# Patient Record
Sex: Female | Born: 1985 | Race: White | Hispanic: No | Marital: Single | Smoking: Former smoker
Health system: Southern US, Community
[De-identification: ages and names within clinical notes are randomized; demographics above are authoritative.]

## PROBLEM LIST (undated history)

## (undated) DIAGNOSIS — N926 Irregular menstruation, unspecified: Principal | ICD-10-CM

## (undated) DIAGNOSIS — N390 Urinary tract infection, site not specified: Secondary | ICD-10-CM

## (undated) DIAGNOSIS — E785 Hyperlipidemia, unspecified: Secondary | ICD-10-CM

## (undated) DIAGNOSIS — R1032 Left lower quadrant pain: Secondary | ICD-10-CM

## (undated) DIAGNOSIS — L309 Dermatitis, unspecified: Secondary | ICD-10-CM

## (undated) DIAGNOSIS — N83202 Unspecified ovarian cyst, left side: Secondary | ICD-10-CM

## (undated) DIAGNOSIS — Z8619 Personal history of other infectious and parasitic diseases: Secondary | ICD-10-CM

## (undated) DIAGNOSIS — Z309 Encounter for contraceptive management, unspecified: Secondary | ICD-10-CM

## (undated) DIAGNOSIS — F0781 Postconcussional syndrome: Secondary | ICD-10-CM

## (undated) DIAGNOSIS — N9489 Other specified conditions associated with female genital organs and menstrual cycle: Secondary | ICD-10-CM

## (undated) DIAGNOSIS — G43109 Migraine with aura, not intractable, without status migrainosus: Secondary | ICD-10-CM

## (undated) DIAGNOSIS — R87619 Unspecified abnormal cytological findings in specimens from cervix uteri: Secondary | ICD-10-CM

## (undated) HISTORY — DX: Postconcussional syndrome: F07.81

## (undated) HISTORY — DX: Left lower quadrant pain: R10.32

## (undated) HISTORY — DX: Encounter for contraceptive management, unspecified: Z30.9

## (undated) HISTORY — DX: Irregular menstruation, unspecified: N92.6

## (undated) HISTORY — DX: Other specified conditions associated with female genital organs and menstrual cycle: N94.89

## (undated) HISTORY — DX: Personal history of other infectious and parasitic diseases: Z86.19

## (undated) HISTORY — DX: Migraine with aura, not intractable, without status migrainosus: G43.109

## (undated) HISTORY — DX: Unspecified ovarian cyst, left side: N83.202

## (undated) HISTORY — DX: Hyperlipidemia, unspecified: E78.5

## (undated) HISTORY — DX: Unspecified abnormal cytological findings in specimens from cervix uteri: R87.619

## (undated) HISTORY — DX: Dermatitis, unspecified: L30.9

---

## 2011-04-02 HISTORY — PX: COLPOSCOPY: SHX161

## 2012-06-10 ENCOUNTER — Emergency Department (HOSPITAL_COMMUNITY)
Admission: EM | Admit: 2012-06-10 | Discharge: 2012-06-10 | Disposition: A | Discharge status: Home / Self Care | Attending: Emergency Medicine | Admitting: Emergency Medicine

## 2012-06-10 ENCOUNTER — Encounter (HOSPITAL_COMMUNITY): Payer: Self-pay

## 2012-06-10 DIAGNOSIS — F17200 Nicotine dependence, unspecified, uncomplicated: Secondary | ICD-10-CM | POA: Insufficient documentation

## 2012-06-10 DIAGNOSIS — N390 Urinary tract infection, site not specified: Secondary | ICD-10-CM

## 2012-06-10 HISTORY — DX: Urinary tract infection, site not specified: N39.0

## 2012-06-10 LAB — URINALYSIS, ROUTINE W REFLEX MICROSCOPIC
Ketones, ur: NEGATIVE mg/dL
Nitrite: POSITIVE — AB
pH: 6 (ref 5.0–8.0)

## 2012-06-10 LAB — URINE MICROSCOPIC-ADD ON

## 2012-06-10 LAB — PREGNANCY, URINE: Preg Test, Ur: NEGATIVE

## 2012-06-10 MED ORDER — NITROFURANTOIN MONOHYD MACRO 100 MG PO CAPS
100.0000 mg | ORAL_CAPSULE | Freq: Two times a day (BID) | ORAL | Status: DC
  Administered 2012-06-10: 100 mg via ORAL
  Filled 2012-06-10: qty 1

## 2012-06-10 MED ORDER — NITROFURANTOIN MONOHYD MACRO 100 MG PO CAPS: 100.0000 mg | ORAL_CAPSULE | Freq: Two times a day (BID) | ORAL | Status: DC

## 2012-06-10 NOTE — ED Notes (Signed)
Pt reports that she had uti several months ago and now feels the same.

## 2012-06-10 NOTE — ED Notes (Signed)
Pt reports having uti 7 months ago and started having same symptoms yesterday, low back pain and describes as "contractions when she pees", denies any urinary freq. Or vaginal discharge.   No fevers. lmp sept. 29.

## 2012-06-10 NOTE — ED Provider Notes (Signed)
History  This chart was scribed for Dione Booze, MD by Bennett Scrape. This patient was seen in room APA12/APA12 and the patient's care was started at 9:30AM.  CSN: [id number]  Arrival date & time 06/10/12  0921   First MD Initiated Contact with Patient 06/10/12 0930      Chief Complaint  Patient presents with  . Urinary Tract Infection     The history is provided by the patient. No language interpreter was used.    Shirley Mendez is a 26 y.o. female with a h/o frequent UTIs who presents to the Emergency Department complaining of gradual onset, gradually worsening, constant right lower back pain described as sharp and non-radiating with associated urinary urgency that started yesterday. She rates her pain is a 6 or 7 out of 10 and states that the pain is aggravated by sitting. She denies having any improving factors. She reports experiencing similar symptoms with a prior diagnosed kidney infection 7 months ago. She states that her LNMP of 05/30/12 and reports that she is currently on birth control pills. She denies hematuria, frequency, fevers, chills, nausea and emesis as associated symptoms. She is a current everyday smoker but denies alcohol use.   Past Medical History  Diagnosis Date  . UTI (lower urinary tract infection)     History reviewed. No pertinent past surgical history.  No family history on file.  History  Substance Use Topics  . Smoking status: Current Every Day Smoker  . Smokeless tobacco: Not on file  . Alcohol Use: No    No OB history provided.  Review of Systems  Constitutional: Negative for fever and chills.  Gastrointestinal: Negative for nausea and vomiting.  Genitourinary: Positive for urgency. Negative for frequency and hematuria.  Musculoskeletal: Positive for back pain.  All other systems reviewed and are negative.    Allergies  Review of patient's allergies indicates no known allergies.  Home Medications  No current outpatient  prescriptions on file.  Triage Vitals: BP 124/76  Pulse 95  Resp 20  Ht 5\' 8"  (1.727 m)  Wt 124 lb (56.246 kg)  BMI 18.85 kg/m2  SpO2 100%  LMP 05/30/2012  Physical Exam  Nursing note and vitals reviewed. Constitutional: She is oriented to person, place, and time. She appears well-developed and well-nourished. No distress.  HENT:  Head: Normocephalic and atraumatic.  Eyes: Conjunctivae normal and EOM are normal.  Neck: Neck supple. No tracheal deviation present.  Cardiovascular: Normal rate and regular rhythm.   Pulmonary/Chest: Effort normal and breath sounds normal. No respiratory distress.  Abdominal: Soft. She exhibits no distension. There is tenderness (moderate tenderness to right mid to lower abdomen). There is no rebound and no guarding.       Right CVA tenderness  Musculoskeletal: Normal range of motion.  Neurological: She is alert and oriented to person, place, and time.  Skin: Skin is warm and dry.  Psychiatric: She has a normal mood and affect. Her behavior is normal.    ED Course  Procedures (including critical care time)  DIAGNOSTIC STUDIES: Oxygen Saturation is 100% on room air, normal by my interpretation.    COORDINATION OF CARE: 9:35AM-Discussed treatment plan which includes an UA with pt at bedside and pt agreed to plan.   Results for orders placed during the hospital encounter of 06/10/12  URINALYSIS, ROUTINE W REFLEX MICROSCOPIC      Component Value Range   Color, Urine YELLOW  YELLOW   APPearance HAZY (*) CLEAR   Specific Gravity, Urine 1.010  1.005 - 1.030   pH 6.0  5.0 - 8.0   Glucose, UA NEGATIVE  NEGATIVE mg/dL   Hgb urine dipstick LARGE (*) NEGATIVE   Bilirubin Urine NEGATIVE  NEGATIVE   Ketones, ur NEGATIVE  NEGATIVE mg/dL   Protein, ur NEGATIVE  NEGATIVE mg/dL   Urobilinogen, UA 0.2  0.0 - 1.0 mg/dL   Nitrite POSITIVE (*) NEGATIVE   Leukocytes, UA SMALL (*) NEGATIVE  PREGNANCY, URINE      Component Value Range   Preg Test, Ur  NEGATIVE  NEGATIVE  URINE MICROSCOPIC-ADD ON      Component Value Range   Squamous Epithelial / LPF MANY (*) RARE   WBC, UA 11-20  <3 WBC/hpf   RBC / HPF 3-6  <3 RBC/hpf   Bacteria, UA MANY (*) RARE    1. Urinary tract infection       MDM  Flank pain which may represent urinary tract infection or ureteral colic.  Urinalysis is come back showing evidence of urinary tract infection with positive nitrite. She is discharged with a prescription for nitrofurantoin.      I personally performed the services described in this documentation, which was scribed in my presence. The recorded information has been reviewed and considered.      Dione Booze, MD 06/10/12 [phone]

## 2012-08-28 ENCOUNTER — Emergency Department (HOSPITAL_BASED_OUTPATIENT_CLINIC_OR_DEPARTMENT_OTHER)
Admission: EM | Admit: 2012-08-28 | Discharge: 2012-08-28 | Disposition: A | Discharge status: Home / Self Care | Attending: Emergency Medicine | Admitting: Emergency Medicine

## 2012-08-28 ENCOUNTER — Encounter (HOSPITAL_BASED_OUTPATIENT_CLINIC_OR_DEPARTMENT_OTHER): Payer: Self-pay | Admitting: *Deleted

## 2012-08-28 DIAGNOSIS — Z79899 Other long term (current) drug therapy: Secondary | ICD-10-CM | POA: Insufficient documentation

## 2012-08-28 DIAGNOSIS — Z3202 Encounter for pregnancy test, result negative: Secondary | ICD-10-CM | POA: Insufficient documentation

## 2012-08-28 DIAGNOSIS — Z87440 Personal history of urinary (tract) infections: Secondary | ICD-10-CM | POA: Insufficient documentation

## 2012-08-28 DIAGNOSIS — J020 Streptococcal pharyngitis: Secondary | ICD-10-CM

## 2012-08-28 DIAGNOSIS — F17200 Nicotine dependence, unspecified, uncomplicated: Secondary | ICD-10-CM | POA: Insufficient documentation

## 2012-08-28 LAB — RAPID STREP SCREEN (MED CTR MEBANE ONLY): Streptococcus, Group A Screen (Direct): POSITIVE — AB

## 2012-08-28 MED ORDER — HYDROCODONE-ACETAMINOPHEN 5-325 MG PO TABS
2.0000 | ORAL_TABLET | Freq: Once | ORAL | Status: AC
  Administered 2012-08-28: 2 via ORAL
  Filled 2012-08-28: qty 2

## 2012-08-28 MED ORDER — HYDROCODONE-ACETAMINOPHEN 5-325 MG PO TABS: 1.0000 | ORAL_TABLET | Freq: Four times a day (QID) | ORAL | Status: DC | PRN

## 2012-08-28 MED ORDER — PENICILLIN G BENZATHINE 1200000 UNIT/2ML IM SUSP
1.2000 10*6.[IU] | Freq: Once | INTRAMUSCULAR | Status: AC
  Administered 2012-08-28: 1.2 10*6.[IU] via INTRAMUSCULAR
  Filled 2012-08-28: qty 2

## 2012-08-28 NOTE — ED Notes (Signed)
Sore throat, fever (102) since Christmas.

## 2012-08-28 NOTE — ED Provider Notes (Signed)
History     CSN: [id number]  Arrival date & time 08/28/12  1320   First MD Initiated Contact with Patient 08/28/12 1455      Chief Complaint  Patient presents with  . Sore Throat    (Consider location/radiation/quality/duration/timing/severity/associated sxs/prior treatment) HPI Pt with history of strep throat presents with about 4 days of fever, sore throat, occasional dry cough and diffuse myalgias. Sore throat is moderate to severe and worse with swallowing.  Past Medical History  Diagnosis Date  . UTI (lower urinary tract infection)     History reviewed. No pertinent past surgical history.  History reviewed. No pertinent family history.  History  Substance Use Topics  . Smoking status: Current Every Day Smoker  . Smokeless tobacco: Not on file  . Alcohol Use: No    OB History    Grav Para Term Preterm Abortions TAB SAB Ect Mult Living                  Review of Systems All other systems reviewed and are negative except as noted in HPI.   Allergies  Review of patient's allergies indicates no known allergies.  Home Medications   Current Outpatient Rx  Name  Route  Sig  Dispense  Refill  . IBUPROFEN 200 MG PO TABS   Oral   Take 800 mg by mouth every 6 (six) hours as needed. pain         . IBUPROFEN 800 MG PO TABS   Oral   Take 800 mg by mouth every 8 (eight) hours as needed. Pain.         Marland Kitchen NITROFURANTOIN MONOHYD MACRO 100 MG PO CAPS   Oral   Take 1 capsule (100 mg total) by mouth 2 (two) times daily.   14 capsule   0   . NORELGESTROMIN-ETH ESTRADIOL 150-20 MCG/24HR TD PTWK   Transdermal   Place 1 patch onto the skin once a week.           BP 124/81  Pulse 102  Temp 98.1 F (36.7 C) (Oral)  Resp 16  Ht 5\' 9"  (1.753 m)  Wt 135 lb (61.236 kg)  BMI 19.94 kg/m2  SpO2 100%  LMP 08/26/2012  Physical Exam  Nursing note and vitals reviewed. Constitutional: She is oriented to person, place, and time. She appears well-developed and  well-nourished.  HENT:  Head: Normocephalic and atraumatic.  Mouth/Throat: Oropharyngeal exudate present.       Tonsils are 1+, erythematous, exudate  Eyes: EOM are normal. Pupils are equal, round, and reactive to light.  Neck: Normal range of motion. Neck supple.  Cardiovascular: Normal rate, normal heart sounds and intact distal pulses.   Pulmonary/Chest: Effort normal and breath sounds normal.  Abdominal: Bowel sounds are normal. She exhibits no distension. There is no tenderness.  Musculoskeletal: Normal range of motion. She exhibits no edema and no tenderness.  Lymphadenopathy:    She has cervical adenopathy.  Neurological: She is alert and oriented to person, place, and time. She has normal strength. No cranial nerve deficit or sensory deficit.  Skin: Skin is warm and dry. No rash noted.  Psychiatric: She has a normal mood and affect.    ED Course  Procedures (including critical care time)  Labs Reviewed  RAPID STREP SCREEN - Abnormal; Notable for the following:    Streptococcus, Group A Screen (Direct) POSITIVE (*)     All other components within normal limits  PREGNANCY, URINE   No results found.  No diagnosis found.    MDM  Strep pharyngitis, PCN, pain meds, PCP followup.         Charles B. Bernette Mayers, MD 08/28/12 1501

## 2013-09-13 ENCOUNTER — Ambulatory Visit: Payer: Self-pay

## 2013-12-24 ENCOUNTER — Emergency Department (HOSPITAL_COMMUNITY)
Admission: EM | Admit: 2013-12-24 | Discharge: 2013-12-24 | Disposition: A | Discharge status: Home / Self Care | Payer: Medicaid Other | Attending: Emergency Medicine | Admitting: Emergency Medicine

## 2013-12-24 ENCOUNTER — Encounter (HOSPITAL_COMMUNITY): Payer: Self-pay | Admitting: Emergency Medicine

## 2013-12-24 ENCOUNTER — Emergency Department (HOSPITAL_COMMUNITY): Payer: Medicaid Other

## 2013-12-24 DIAGNOSIS — F17200 Nicotine dependence, unspecified, uncomplicated: Secondary | ICD-10-CM | POA: Insufficient documentation

## 2013-12-24 DIAGNOSIS — Z3202 Encounter for pregnancy test, result negative: Secondary | ICD-10-CM | POA: Insufficient documentation

## 2013-12-24 DIAGNOSIS — N390 Urinary tract infection, site not specified: Secondary | ICD-10-CM | POA: Insufficient documentation

## 2013-12-24 LAB — URINALYSIS, ROUTINE W REFLEX MICROSCOPIC
BILIRUBIN URINE: NEGATIVE
GLUCOSE, UA: NEGATIVE mg/dL
KETONES UR: NEGATIVE mg/dL
Leukocytes, UA: NEGATIVE
Nitrite: POSITIVE — AB
Specific Gravity, Urine: 1.02 (ref 1.005–1.030)
Urobilinogen, UA: 0.2 mg/dL (ref 0.0–1.0)
pH: 6 (ref 5.0–8.0)

## 2013-12-24 LAB — COMPREHENSIVE METABOLIC PANEL
ALBUMIN: 3.9 g/dL (ref 3.5–5.2)
ALT: 14 U/L (ref 0–35)
AST: 17 U/L (ref 0–37)
Alkaline Phosphatase: 57 U/L (ref 39–117)
BUN: 13 mg/dL (ref 6–23)
CHLORIDE: 98 meq/L (ref 96–112)
CO2: 24 mEq/L (ref 19–32)
CREATININE: 0.73 mg/dL (ref 0.50–1.10)
Calcium: 10 mg/dL (ref 8.4–10.5)
GFR calc Af Amer: >90 mL/min (ref >90)
GFR calc non Af Amer: >90 mL/min (ref >90)
Glucose, Bld: 65 mg/dL — ABNORMAL LOW (ref 70–99)
Potassium: 3.5 mEq/L — ABNORMAL LOW (ref 3.7–5.3)
SODIUM: 138 meq/L (ref 137–147)
Total Bilirubin: 0.6 mg/dL (ref 0.3–1.2)
Total Protein: 7.6 g/dL (ref 6.0–8.3)

## 2013-12-24 LAB — CBC WITH DIFFERENTIAL/PLATELET
Basophils Absolute: 0 10*3/uL (ref 0.0–0.1)
Basophils Relative: 0 % (ref 0–1)
EOS ABS: 0.1 10*3/uL (ref 0.0–0.7)
EOS PCT: 1 % (ref 0–5)
HCT: 40.4 % (ref 36.0–46.0)
Hemoglobin: 14.1 g/dL (ref 12.0–15.0)
LYMPHS ABS: 1.9 10*3/uL (ref 0.7–4.0)
Lymphocytes Relative: 14 % (ref 12–46)
MCH: 30.5 pg (ref 26.0–34.0)
MCHC: 34.9 g/dL (ref 30.0–36.0)
MCV: 87.3 fL (ref 78.0–100.0)
Monocytes Absolute: 1.1 10*3/uL — ABNORMAL HIGH (ref 0.1–1.0)
Monocytes Relative: 8 % (ref 3–12)
Neutro Abs: 10.2 10*3/uL — ABNORMAL HIGH (ref 1.7–7.7)
Neutrophils Relative %: 77 % (ref 43–77)
PLATELETS: 174 10*3/uL (ref 150–400)
RBC: 4.63 MIL/uL (ref 3.87–5.11)
RDW: 12.1 % (ref 11.5–15.5)
WBC: 13.2 10*3/uL — ABNORMAL HIGH (ref 4.0–10.5)

## 2013-12-24 LAB — URINE MICROSCOPIC-ADD ON

## 2013-12-24 LAB — PREGNANCY, URINE: Preg Test, Ur: NEGATIVE

## 2013-12-24 LAB — LIPASE, BLOOD: LIPASE: 15 U/L (ref 11–59)

## 2013-12-24 MED ORDER — OXYCODONE-ACETAMINOPHEN 5-325 MG PO TABS: 2.0000 | ORAL_TABLET | ORAL | Status: DC | PRN

## 2013-12-24 MED ORDER — PROMETHAZINE HCL 25 MG PO TABS: 25.0000 mg | ORAL_TABLET | Freq: Four times a day (QID) | ORAL | Status: DC | PRN

## 2013-12-24 MED ORDER — ONDANSETRON HCL 4 MG/2ML IJ SOLN
4.0000 mg | Freq: Once | INTRAMUSCULAR | Status: AC
  Administered 2013-12-24: 4 mg via INTRAVENOUS
  Filled 2013-12-24: qty 2

## 2013-12-24 MED ORDER — DEXTROSE 5 % IV SOLN: 1.0000 g | Freq: Once | INTRAVENOUS | Status: DC

## 2013-12-24 MED ORDER — CIPROFLOXACIN HCL 250 MG PO TABS
500.0000 mg | ORAL_TABLET | Freq: Once | ORAL | Status: AC
  Administered 2013-12-24: 500 mg via ORAL
  Filled 2013-12-24: qty 2

## 2013-12-24 MED ORDER — SODIUM CHLORIDE 0.9 % IV BOLUS (SEPSIS)
1000.0000 mL | Freq: Once | INTRAVENOUS | Status: AC
  Administered 2013-12-24: 1000 mL via INTRAVENOUS

## 2013-12-24 MED ORDER — SODIUM CHLORIDE 0.9 % IV SOLN
Freq: Once | INTRAVENOUS | Status: AC
  Administered 2013-12-24: 13:00:00 via INTRAVENOUS

## 2013-12-24 MED ORDER — CIPROFLOXACIN HCL 500 MG PO TABS: 500.0000 mg | ORAL_TABLET | Freq: Two times a day (BID) | ORAL | Status: DC

## 2013-12-24 MED ORDER — MORPHINE SULFATE 4 MG/ML IJ SOLN
4.0000 mg | Freq: Once | INTRAMUSCULAR | Status: AC
  Administered 2013-12-24: 4 mg via INTRAVENOUS
  Filled 2013-12-24: qty 1

## 2013-12-24 MED ORDER — MORPHINE SULFATE 4 MG/ML IJ SOLN
INTRAMUSCULAR | Status: AC
  Administered 2013-12-24: 4 mg via INTRAVENOUS
  Filled 2013-12-24: qty 1

## 2013-12-24 MED ORDER — HYDROMORPHONE HCL PF 1 MG/ML IJ SOLN
1.0000 mg | Freq: Once | INTRAMUSCULAR | Status: AC
  Administered 2013-12-24: 1 mg via INTRAVENOUS
  Filled 2013-12-24: qty 1

## 2013-12-24 MED ORDER — KETOROLAC TROMETHAMINE 30 MG/ML IJ SOLN
INTRAMUSCULAR | Status: AC
  Administered 2013-12-24: 30 mg
  Filled 2013-12-24: qty 1

## 2013-12-24 MED ORDER — MORPHINE SULFATE 4 MG/ML IJ SOLN
4.0000 mg | Freq: Once | INTRAMUSCULAR | Status: AC
  Administered 2013-12-24: 4 mg via INTRAVENOUS

## 2013-12-24 NOTE — ED Notes (Signed)
Talked with EDP about dc vital. vo to run 3rd liter wo.

## 2013-12-24 NOTE — ED Notes (Signed)
Advised pt of another liter. Pt upset and does not agree with diagnosis. Asked pt if there was anything else we could do for her and advised we wanted her to feel like she received very good care. Pt shook head no but agreed to 3rd liter.

## 2013-12-24 NOTE — Discharge Instructions (Signed)
We will treat you for a urinary tract infection. Increase fluids. Antibiotic twice a day.   Also medication for pain and nausea

## 2013-12-24 NOTE — ED Provider Notes (Signed)
CSN: [id number]     Arrival date & time 12/24/13  [id number] History  This chart was scribed for Shirley HutchingBrian Kobe Jansma, MD by Quintella ReichertMatthew Underwood, ED scribe.  This patient was seen in room APA04/APA04 and the patient's care was started at 7:49 AM.    Chief Complaint  Patient presents with  . Flank Pain    The history is provided by the patient. No language interpreter was used.    HPI Comments: Shirley Mendez is a 28 y.o. female who presents to the Emergency Department complaining of moderate right flank pain that began last night at around 9:45 PM.  Pt states pain radiates intermittently to the right lateral aspect or her abdomen.  She also reports decreased urine output and states she feels dehydrated in spite of drinking sufficient fluids, although she notes she frequently does have difficulty "staying fully hydrated."  She denies associated fever, dysuria, or frequency.  Pt admits to prior h/o similar symptoms which were due to a kidney infection and treated successfully with home antibiotics.  She denies h/o kidney stones.  She denies any other chronic medical conditions or regular medication usage.   History reviewed. No pertinent past medical history.  History reviewed. No pertinent past surgical history.  History reviewed. No pertinent family history.   History  Substance Use Topics  . Smoking status: Current Every Day Smoker  . Smokeless tobacco: Not on file  . Alcohol Use: No    OB History   Grav Para Term Preterm Abortions TAB SAB Ect Mult Living                   Review of Systems A complete 10 system review of systems was obtained and all systems are negative except as noted in the HPI and PMH.     Allergies  Review of patient's allergies indicates no known allergies.  Home Medications   Prior to Admission medications   Not on File   BP 116/72  Pulse 112  Temp(Src) 98.3 F (36.8 C) (Oral)  Resp 20  Ht 5\' 7"  (1.702 m)  Wt 141 lb (63.957 kg)  BMI 22.08 kg/m2  SpO2 100%   LMP 12/18/2013  Physical Exam  Nursing note and vitals reviewed. Constitutional: She is oriented to person, place, and time. She appears well-developed and well-nourished.  HENT:  Head: Normocephalic and atraumatic.  Mouth/Throat: Mucous membranes are not dry.  Eyes: Conjunctivae and EOM are normal. Pupils are equal, round, and reactive to light.  Neck: Normal range of motion. Neck supple.  Cardiovascular: Normal rate, regular rhythm and normal heart sounds.   Pulmonary/Chest: Effort normal and breath sounds normal.  Abdominal: Soft. Bowel sounds are normal. There is tenderness (right flank).  Musculoskeletal: Normal range of motion.  Neurological: She is alert and oriented to person, place, and time.  Skin: Skin is warm and dry.  Psychiatric: She has a normal mood and affect. Her behavior is normal.    ED Course  Procedures (including critical care time)  DIAGNOSTIC STUDIES: Oxygen Saturation is 100% on room air, normal by my interpretation.    COORDINATION OF CARE: 7:53 AM-Discussed treatment plan which includes IV fluids, pain management, and UA with pt at bedside and pt agreed to plan.    Results for orders placed during the hospital encounter of 12/24/13  URINALYSIS, ROUTINE W REFLEX MICROSCOPIC      Result Value Ref Range   Color, Urine YELLOW  YELLOW   APPearance CLOUDY (*) CLEAR   Specific Gravity,  Urine 1.020  1.005 - 1.030   pH 6.0  5.0 - 8.0   Glucose, UA NEGATIVE  NEGATIVE mg/dL   Hgb urine dipstick LARGE (*) NEGATIVE   Bilirubin Urine NEGATIVE  NEGATIVE   Ketones, ur NEGATIVE  NEGATIVE mg/dL   Protein, ur TRACE (*) NEGATIVE mg/dL   Urobilinogen, UA 0.2  0.0 - 1.0 mg/dL   Nitrite POSITIVE (*) NEGATIVE   Leukocytes, UA NEGATIVE  NEGATIVE  PREGNANCY, URINE      Result Value Ref Range   Preg Test, Ur NEGATIVE  NEGATIVE  URINE MICROSCOPIC-ADD ON      Result Value Ref Range   Squamous Epithelial / LPF FEW (*) RARE   WBC, UA 0-2  <3 WBC/hpf   RBC / HPF 3-6   <3 RBC/hpf   Bacteria, UA MANY (*) RARE  COMPREHENSIVE METABOLIC PANEL      Result Value Ref Range   Sodium 138  137 - 147 mEq/L   Potassium 3.5 (*) 3.7 - 5.3 mEq/L   Chloride 98  96 - 112 mEq/L   CO2 24  19 - 32 mEq/L   Glucose, Bld 65 (*) 70 - 99 mg/dL   BUN 13  6 - 23 mg/dL   Creatinine, Ser 1.61  0.50 - 1.10 mg/dL   Calcium 09.6  8.4 - 04.5 mg/dL   Total Protein 7.6  6.0 - 8.3 g/dL   Albumin 3.9  3.5 - 5.2 g/dL   AST 17  0 - 37 U/L   ALT 14  0 - 35 U/L   Alkaline Phosphatase 57  39 - 117 U/L   Total Bilirubin 0.6  0.3 - 1.2 mg/dL   GFR calc non Af Amer >90  >90 mL/min   GFR calc Af Amer >90  >90 mL/min  CBC WITH DIFFERENTIAL      Result Value Ref Range   WBC 13.2 (*) 4.0 - 10.5 K/uL   RBC 4.63  3.87 - 5.11 MIL/uL   Hemoglobin 14.1  12.0 - 15.0 g/dL   HCT 40.9  81.1 - 91.4 %   MCV 87.3  78.0 - 100.0 fL   MCH 30.5  26.0 - 34.0 pg   MCHC 34.9  30.0 - 36.0 g/dL   RDW 78.2  95.6 - 21.3 %   Platelets 174  150 - 400 K/uL   Neutrophils Relative % 77  43 - 77 %   Neutro Abs 10.2 (*) 1.7 - 7.7 K/uL   Lymphocytes Relative 14  12 - 46 %   Lymphs Abs 1.9  0.7 - 4.0 K/uL   Monocytes Relative 8  3 - 12 %   Monocytes Absolute 1.1 (*) 0.1 - 1.0 K/uL   Eosinophils Relative 1  0 - 5 %   Eosinophils Absolute 0.1  0.0 - 0.7 K/uL   Basophils Relative 0  0 - 1 %   Basophils Absolute 0.0  0.0 - 0.1 K/uL  LIPASE, BLOOD      Result Value Ref Range   Lipase 15  11 - 59 U/L       MDM   Final diagnoses:  Urinary tract infection    Suspect urinary tract infection. CT A/P shows no kidney stone. Patient feels better after IV fluids and pain management.   We'll start Cipro twice a day along with Percocet and Phenergan 25 mg.  No acute abdomen.    I personally performed the services described in this documentation, which was scribed in my presence. The recorded  information has been reviewed and is accurate.    Shirley HutchingBrian Ayron Fillinger, MD 12/24/13 [phone]

## 2013-12-24 NOTE — ED Notes (Signed)
Pt c/o right flank pain that radiates around to right abd area at times that started last night, associated with nausea,

## 2013-12-24 NOTE — ED Notes (Signed)
Apple juice and graham crackers given due to borderline low bs

## 2013-12-26 ENCOUNTER — Encounter (HOSPITAL_BASED_OUTPATIENT_CLINIC_OR_DEPARTMENT_OTHER): Payer: Self-pay | Admitting: *Deleted

## 2013-12-27 ENCOUNTER — Emergency Department (HOSPITAL_COMMUNITY): Payer: Medicaid Other

## 2013-12-27 ENCOUNTER — Encounter (HOSPITAL_COMMUNITY): Payer: Self-pay | Admitting: Emergency Medicine

## 2013-12-27 ENCOUNTER — Emergency Department (HOSPITAL_COMMUNITY)
Admission: EM | Admit: 2013-12-27 | Discharge: 2013-12-27 | Disposition: A | Discharge status: Home / Self Care | Payer: Medicaid Other | Attending: Emergency Medicine | Admitting: Emergency Medicine

## 2013-12-27 DIAGNOSIS — N12 Tubulo-interstitial nephritis, not specified as acute or chronic: Secondary | ICD-10-CM

## 2013-12-27 DIAGNOSIS — Z792 Long term (current) use of antibiotics: Secondary | ICD-10-CM | POA: Insufficient documentation

## 2013-12-27 DIAGNOSIS — F17200 Nicotine dependence, unspecified, uncomplicated: Secondary | ICD-10-CM | POA: Insufficient documentation

## 2013-12-27 DIAGNOSIS — Z87440 Personal history of urinary (tract) infections: Secondary | ICD-10-CM | POA: Insufficient documentation

## 2013-12-27 DIAGNOSIS — R000 Tachycardia, unspecified: Secondary | ICD-10-CM | POA: Insufficient documentation

## 2013-12-27 LAB — CBC WITH DIFFERENTIAL/PLATELET
BASOS ABS: 0 10*3/uL (ref 0.0–0.1)
BASOS PCT: 0 % (ref 0–1)
EOS ABS: 0 10*3/uL (ref 0.0–0.7)
EOS PCT: 0 % (ref 0–5)
HCT: 39.2 % (ref 36.0–46.0)
Hemoglobin: 13.6 g/dL (ref 12.0–15.0)
LYMPHS ABS: 2.1 10*3/uL (ref 0.7–4.0)
Lymphocytes Relative: 22 % (ref 12–46)
MCH: 30.1 pg (ref 26.0–34.0)
MCHC: 34.7 g/dL (ref 30.0–36.0)
MCV: 86.7 fL (ref 78.0–100.0)
Monocytes Absolute: 1.5 10*3/uL — ABNORMAL HIGH (ref 0.1–1.0)
Monocytes Relative: 17 % — ABNORMAL HIGH (ref 3–12)
NEUTROS PCT: 61 % (ref 43–77)
Neutro Abs: 5.6 10*3/uL (ref 1.7–7.7)
PLATELETS: 179 10*3/uL (ref 150–400)
RBC: 4.52 MIL/uL (ref 3.87–5.11)
RDW: 12 % (ref 11.5–15.5)
WBC: 9.3 10*3/uL (ref 4.0–10.5)

## 2013-12-27 LAB — URINALYSIS, ROUTINE W REFLEX MICROSCOPIC
BILIRUBIN URINE: NEGATIVE
GLUCOSE, UA: NEGATIVE mg/dL
KETONES UR: NEGATIVE mg/dL
NITRITE: NEGATIVE
PH: 7 (ref 5.0–8.0)
Protein, ur: NEGATIVE mg/dL
Urobilinogen, UA: 2 mg/dL — ABNORMAL HIGH (ref 0.0–1.0)

## 2013-12-27 LAB — URINE MICROSCOPIC-ADD ON

## 2013-12-27 LAB — COMPREHENSIVE METABOLIC PANEL
ALBUMIN: 3.4 g/dL — AB (ref 3.5–5.2)
ALK PHOS: 49 U/L (ref 39–117)
ALT: 9 U/L (ref 0–35)
AST: 11 U/L (ref 0–37)
BUN: 11 mg/dL (ref 6–23)
CALCIUM: 9.6 mg/dL (ref 8.4–10.5)
CO2: 23 mEq/L (ref 19–32)
Chloride: 97 mEq/L (ref 96–112)
Creatinine, Ser: 0.71 mg/dL (ref 0.50–1.10)
GFR calc Af Amer: >90 mL/min (ref >90)
GFR calc non Af Amer: >90 mL/min (ref >90)
Glucose, Bld: 110 mg/dL — ABNORMAL HIGH (ref 70–99)
POTASSIUM: 3.7 meq/L (ref 3.7–5.3)
SODIUM: 135 meq/L — AB (ref 137–147)
TOTAL PROTEIN: 7.7 g/dL (ref 6.0–8.3)
Total Bilirubin: 0.5 mg/dL (ref 0.3–1.2)

## 2013-12-27 LAB — D-DIMER, QUANTITATIVE: D-Dimer, Quant: 1.32 ug/mL-FEU — ABNORMAL HIGH (ref 0.00–0.48)

## 2013-12-27 MED ORDER — SODIUM CHLORIDE 0.9 % IV BOLUS (SEPSIS)
1000.0000 mL | Freq: Once | INTRAVENOUS | Status: AC
  Administered 2013-12-27: 1000 mL via INTRAVENOUS

## 2013-12-27 MED ORDER — IOHEXOL 350 MG/ML SOLN
100.0000 mL | Freq: Once | INTRAVENOUS | Status: AC | PRN
  Administered 2013-12-27: 100 mL via INTRAVENOUS

## 2013-12-27 MED ORDER — DEXTROSE 5 % IV SOLN
1.0000 g | Freq: Once | INTRAVENOUS | Status: AC
  Administered 2013-12-27: 1 g via INTRAVENOUS
  Filled 2013-12-27: qty 10

## 2013-12-27 MED ORDER — SULFAMETHOXAZOLE-TRIMETHOPRIM 800-160 MG PO TABS: 1.0000 | ORAL_TABLET | Freq: Two times a day (BID) | ORAL | Status: DC

## 2013-12-27 NOTE — ED Notes (Signed)
MD at bedside. PA student

## 2013-12-27 NOTE — ED Provider Notes (Signed)
CSN: [id number]     Arrival date & time 12/27/13  1022 History   First MD Initiated Contact with Patient 12/27/13 1038     Chief Complaint  Patient presents with  . Pleurisy     (Consider location/radiation/quality/duration/timing/severity/associated sxs/prior Treatment) The history is provided by the patient and medical records. No language interpreter was used.    Shirley Mendez is a 28 y.o. female  with a hx of UTI presents to the Emergency Department complaining of gradual, persistent, progressively worsening sharp, right pleuritic chest pain located under the ribs that started 4 days ago. Taking a deep breath and food (soup) worsens the pain. Denies SOB, leg swelling, abdominal pain, cough, hemoptysis. Denies recent surgeries or long distance travel, but is on birth control and smokes 1/2 PPD.  Pt was evaluated at [facility] 3 days ago and diagnosed with a UTI.  She was discharged home on percocet and Cipro. Pt has been taking both of these without relief.     Past Medical History  Diagnosis Date  . UTI (lower urinary tract infection)    History reviewed. No pertinent past surgical history. No family history on file. History  Substance Use Topics  . Smoking status: Current Every Day Smoker  . Smokeless tobacco: Not on file  . Alcohol Use: No   OB History   Grav Para Term Preterm Abortions TAB SAB Ect Mult Living                 Review of Systems  Constitutional: Negative for fever, diaphoresis, appetite change, fatigue and unexpected weight change.  HENT: Negative for mouth sores.   Eyes: Negative for visual disturbance.  Respiratory: Positive for shortness of breath. Negative for cough, chest tightness and wheezing.   Cardiovascular: Positive for chest pain (right lower).  Gastrointestinal: Negative for nausea, vomiting, abdominal pain, diarrhea and constipation.  Endocrine: Negative for polydipsia, polyphagia and polyuria.  Genitourinary: Positive for flank pain (right).  Negative for dysuria, urgency, frequency and hematuria.  Musculoskeletal: Negative for back pain and neck stiffness.  Skin: Negative for rash.  Allergic/Immunologic: Negative for immunocompromised state.  Neurological: Negative for syncope, light-headedness and headaches.  Hematological: Does not bruise/bleed easily.  Psychiatric/Behavioral: Negative for sleep disturbance. The patient is not nervous/anxious.       Allergies  Review of patient's allergies indicates no known allergies.  Home Medications   Prior to Admission medications   Medication Sig Start Date End Date Taking? Authorizing Provider  ciprofloxacin (CIPRO) 500 MG tablet Take 1 tablet (500 mg total) by mouth 2 (two) times daily. 12/24/13  Yes Donnetta Hutching, MD  ibuprofen (ADVIL,MOTRIN) 200 MG tablet Take 800-1,000 mg by mouth every 6 (six) hours as needed for moderate pain.   Yes Historical Provider, MD  norelgestromin-ethinyl estradiol (ORTHO EVRA) 150-20 MCG/24HR transdermal patch Place 1 patch onto the skin once a week.   Yes Historical Provider, MD  oxyCODONE-acetaminophen (PERCOCET/ROXICET) 5-325 MG per tablet Take 1 tablet by mouth every 4 (four) hours as needed for moderate pain or severe pain.   Yes Historical Provider, MD  promethazine (PHENERGAN) 25 MG tablet Take 25 mg by mouth every 6 (six) hours as needed for nausea or vomiting.   Yes Historical Provider, MD   BP 113/77  Pulse 86  Temp(Src) 98.9 F (37.2 C) (Oral)  Resp 17  SpO2 98%  LMP 12/18/2013 Physical Exam  Nursing note and vitals reviewed. Constitutional: She is oriented to person, place, and time. She appears well-developed and well-nourished.  No distress.  Awake, alert, nontoxic appearance  HENT:  Head: Normocephalic and atraumatic.  Mouth/Throat: Oropharynx is clear and moist. No oropharyngeal exudate.  Eyes: Conjunctivae are normal. No scleral icterus.  Neck: Normal range of motion. Neck supple.  Cardiovascular: Regular rhythm, normal heart  sounds and intact distal pulses.   Tachycardia  Pulmonary/Chest: Effort normal and breath sounds normal. No respiratory distress. She has no wheezes. She exhibits tenderness.  Splinting with deep breathing Tender with deep palpation over the right lower ribs  Abdominal: Soft. Bowel sounds are normal. She exhibits no distension and no mass. There is no tenderness. There is CVA tenderness (right). There is no rebound and no guarding.  abd soft and nontender Right CVA tenderness (along the right rib region and just below)    Musculoskeletal: Normal range of motion. She exhibits no edema.  Left calf tenderness to palpation No peripheral edema noted on exam Negative Homan's sign  Neurological: She is alert and oriented to person, place, and time. She exhibits normal muscle tone. Coordination normal.  Speech is clear and goal oriented Moves extremities without ataxia  Skin: Skin is warm and dry. No rash noted. She is not diaphoretic.  Psychiatric: She has a normal mood and affect.    ED Course  Procedures (including critical care time) Labs Review Labs Reviewed  CBC WITH DIFFERENTIAL - Abnormal; Notable for the following:    Monocytes Relative 17 (*)    Monocytes Absolute 1.5 (*)    All other components within normal limits  COMPREHENSIVE METABOLIC PANEL - Abnormal; Notable for the following:    Sodium 135 (*)    Glucose, Bld 110 (*)    Albumin 3.4 (*)    All other components within normal limits  URINALYSIS, ROUTINE W REFLEX MICROSCOPIC - Abnormal; Notable for the following:    Specific Gravity, Urine <1.005 (*)    Hgb urine dipstick TRACE (*)    Urobilinogen, UA 2.0 (*)    Leukocytes, UA MODERATE (*)    All other components within normal limits  D-DIMER, QUANTITATIVE - Abnormal; Notable for the following:    D-Dimer, Quant 1.32 (*)    All other components within normal limits  URINE MICROSCOPIC-ADD ON - Abnormal; Notable for the following:    Squamous Epithelial / LPF MANY (*)     Bacteria, UA FEW (*)    All other components within normal limits  URINE CULTURE    Imaging Review Dg Chest 2 View  12/27/2013   CLINICAL DATA:  RIGHT flank pain, smoker  EXAM: CHEST  2 VIEW  COMPARISON:  None  FINDINGS: Normal heart size, mediastinal contours, and pulmonary vascularity.  Mild peribronchial thickening.  No pulmonary infiltrate, pleural effusion, or pneumothorax.  RIGHT nipple shadow.  Bones unremarkable.  IMPRESSION: Mild bronchitic changes without infiltrate.   Electronically Signed   By: Ulyses SouthwardMark  Boles M.D.   On: 12/27/2013 11:11   Ct Angio Chest Pe W/cm &/or Wo Cm  12/27/2013   CLINICAL DATA:  Right upper rib/flank pain, shortness of breath  EXAM: CT ANGIOGRAPHY CHEST WITH CONTRAST  TECHNIQUE: Multidetector CT imaging of the chest was performed using the standard protocol during bolus administration of intravenous contrast. Multiplanar CT image reconstructions and MIPs were obtained to evaluate the vascular anatomy.  CONTRAST:  100mL OMNIPAQUE IOHEXOL 350 MG/ML SOLN  COMPARISON:  Chest radiographs dated 12/27/2013  FINDINGS: No evidence of pulmonary embolism.  No pulmonary nodules. Mild atelectasis in the right middle lobe and bilateral lung bases. Mild mosaic  attenuation in the bilateral lower lobes. No pleural effusion or pneumothorax.  Heart is normal in size.  No pericardial effusion.  No suspicious mediastinal, hilar, or axillary lymphadenopathy.  Visualized upper abdomen is unremarkable.  Visualized osseous structures are within normal limits.  Review of the MIP images confirms the above findings.  IMPRESSION: No evidence of pulmonary embolism.  No evidence of acute cardiopulmonary disease.   Electronically Signed   By: Charline BillsSriyesh  Krishnan M.D.   On: 12/27/2013 13:25     EKG Interpretation   Date/Time:  Tuesday December 27 2013 11:30:48 EDT Ventricular Rate:  96 PR Interval:  166 QRS Duration: 88 QT Interval:  325 QTC Calculation: 411 R Axis:   101 Text Interpretation:   Sinus rhythm Borderline right axis deviation  Baseline wander in lead(s) III No previous tracing Confirmed by BEATON   MD, ROBERT ([id number]) on 12/27/2013 3:02:49 PM      MDM   Final diagnoses:  Pyelonephritis   Shirley LuzJacqlyn Mendez presents with right flank pain flank pain, tachycardia and splinting with deep inspiration.  Pt with several risk factors for PE will obtain d-dimer and reassess.  Pt declines pain control at this time.  Pt remains tachycardic on my exam.  CT abd pelvis without contrast 3 days ago shows no evidence of choledocholithiasis, nephrolithiasis or renal/ureteral calculi.  I personally reviewed the imaging tests through PACS system obtained on 12/24/13.  I reviewed available ER/hospitalization records through the EMR.    D-dimer positive.  Will obtain CT angio chest.  Pt with mild TTP of the left calf, but no other clinical evidence of DVT (no edema, swelling, erythema or pain with ambulation, negative Homan's sign).    2:06PM CT angiogram is without evidence of pulmonary embolism.. Patient clinically without concerning symptoms of DVT. I do not believe that she needs a venous duplex at this time.  Urinalysis pending.  2:58 PM Tachycardia resolving after fluid bolus.  UA with evidence of UTI and patient's pain, CVA tenderness and urinary tract infection leads me to believe that she has Tylenol arthritis. She is afebrile here in the emergency department without hypotension. She's tolerating by mouth without difficulty and does not have intractable vomiting.  Patient with stable vital signs I believe that she can be treated for her pyelonephritis on an outpatient basis. Patient has been taking Cipro but there was no urine culture obtained 3 days ago. Urine culture will be sent today and patient will be discharged with Bactrim. She was given  Rocephin IV here in the emergency department.  Patient has a primary care physician and she reports she will be able to followup within 3 days.   Recommend return here to the emergency department for high fevers, intractable vomiting or worsening pain.    It has been determined that no acute conditions requiring further emergency intervention are present at this time. The patient/guardian have been advised of the diagnosis and plan. We have discussed signs and symptoms that warrant return to the ED, such as changes or worsening in symptoms.   Vital signs are stable at discharge.   BP 113/77  Pulse 86  Temp(Src) 98.9 F (37.2 C) (Oral)  Resp 17  SpO2 98%  LMP 12/18/2013  Patient/guardian has voiced understanding and agreed to follow-up with the PCP or specialist.      Dierdre ForthHannah Kyaire Gruenewald, PA-C 12/27/13 1503

## 2013-12-27 NOTE — ED Notes (Signed)
PT complains of R sided flank pain 4/10 for ~1week. Pain has now migrated to lateral and front R mid-abdominal area. PT tender upon palpation. [facility] and was dx'd with UTI and has been taking medication (Cipro, Phenegran, and oxycodone) since her Saturday visit. PT states pain has worsened, not improved. PT denies pain, urgency, fq w/urination and states never had these sx. Denies fever

## 2013-12-27 NOTE — ED Notes (Signed)
Transferred care to Shirley Mendez 

## 2013-12-27 NOTE — ED Notes (Signed)
Pt started with right lateral rib/flank pain 4 days ago. Went to [facility] 3 days ago . 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 Was put on Cipro. Pain persists and pt is tachycardic.

## 2013-12-27 NOTE — ED Provider Notes (Signed)
Medical screening examination/treatment/procedure(s) were performed by non-physician practitioner and as supervising physician I was immediately available for consultation/collaboration.    Ayven Glasco L Beryle Bagsby, MD 12/27/13 1503 

## 2013-12-27 NOTE — ED Notes (Signed)
Pt moved to POD E 37. [facility], RN.

## 2013-12-27 NOTE — Discharge Instructions (Signed)
1. Medications: bactrim, usual home medications 2. Treatment: rest, drink plenty of fluids,  3. Follow Up: Please followup with your primary doctor for discussion of your diagnoses and further evaluation after today's visit; if you do not have a primary care doctor use the resource guide provided to find one;     Pyelonephritis, Adult Pyelonephritis is a kidney infection. A kidney infection can happen quickly, or it can last for a long time. HOME CARE   Take your medicine (antibiotics) as told. Finish it even if you start to feel better.  Keep all doctor visits as told.  Drink enough fluids to keep your pee (urine) clear or pale yellow.  Only take medicine as told by your doctor. GET HELP RIGHT AWAY IF:   You have a fever or lasting symptoms for more than 2-3 days.  You have a fever and your symptoms suddenly get worse.  You cannot take your medicine or drink fluids as told.  You have chills and shaking.  You feel very weak or pass out (faint).  You do not feel better after 2 days. MAKE SURE YOU:  Understand these instructions.  Will watch your condition.  Will get help right away if you are not doing well or get worse. Document Released: 09/25/2004 Document Revised: 02/17/2012 Document Reviewed: 02/05/2011 [facility] Patient Information 2014 [place], [place].

## 2013-12-28 LAB — URINE CULTURE
COLONY COUNT: NO GROWTH
Culture: NO GROWTH

## 2014-06-05 ENCOUNTER — Ambulatory Visit (INDEPENDENT_AMBULATORY_CARE_PROVIDER_SITE_OTHER): Payer: Medicaid Other | Admitting: Women's Health

## 2014-06-05 ENCOUNTER — Encounter: Payer: Self-pay | Admitting: Women's Health

## 2014-06-05 ENCOUNTER — Other Ambulatory Visit (HOSPITAL_COMMUNITY)
Admission: RE | Admit: 2014-06-05 | Discharge: 2014-06-05 | Disposition: A | Discharge status: Home / Self Care | Payer: Medicaid Other | Source: Ambulatory Visit | Attending: Obstetrics & Gynecology | Admitting: Obstetrics & Gynecology

## 2014-06-05 VITALS — BP 118/72 | Ht 66.25 in | Wt 139.0 lb

## 2014-06-05 DIAGNOSIS — Z1151 Encounter for screening for human papillomavirus (HPV): Secondary | ICD-10-CM | POA: Insufficient documentation

## 2014-06-05 DIAGNOSIS — R87810 Cervical high risk human papillomavirus (HPV) DNA test positive: Secondary | ICD-10-CM | POA: Diagnosis present

## 2014-06-05 DIAGNOSIS — Z113 Encounter for screening for infections with a predominantly sexual mode of transmission: Secondary | ICD-10-CM | POA: Insufficient documentation

## 2014-06-05 DIAGNOSIS — Z01419 Encounter for gynecological examination (general) (routine) without abnormal findings: Secondary | ICD-10-CM | POA: Insufficient documentation

## 2014-06-05 DIAGNOSIS — N9489 Other specified conditions associated with female genital organs and menstrual cycle: Secondary | ICD-10-CM

## 2014-06-05 DIAGNOSIS — G43109 Migraine with aura, not intractable, without status migrainosus: Secondary | ICD-10-CM

## 2014-06-05 DIAGNOSIS — F17200 Nicotine dependence, unspecified, uncomplicated: Secondary | ICD-10-CM | POA: Insufficient documentation

## 2014-06-05 DIAGNOSIS — Z8742 Personal history of other diseases of the female genital tract: Secondary | ICD-10-CM

## 2014-06-05 DIAGNOSIS — Z0000 Encounter for general adult medical examination without abnormal findings: Secondary | ICD-10-CM

## 2014-06-05 NOTE — Progress Notes (Signed)
Patient ID: Shirley Mendez, female   DOB: 1986-04-09, 28 y.o.   MRN: [id number] Subjective:   Shirley Mendez is a 28 y.o. 473P2012 Caucasian female here for a routine well-woman exam. This is her first time here at our practice.  Patient's last menstrual period was 05/08/2014.   Smokes 1/2ppd, is interested in quitting- would like referral to [facility]. No h/o HTN, DVT/PE, CVA, MI. She does have migraines and reports seeing black dots for the 1st 30mins of her migraines, this to me sounds like a form of aura, she has never seen a neurologist to confirm this. Has tried IUD in past, has pelvic congestion syndrome- so this was very uncomfortable for her and she had it removed. Gained 18lbs w/ depo during 1 month, declines nexplanon or POPs.  She has a h/o recurrent UTIs/pyleonephritis, PCP is monitoring this- hasn't seen urology.   Current complaints: none [facility]       Does desire labs  Social History: Sexual: heterosexual Marital Status: dating Living situation: with partner / significant other Occupation: independent homehealth CNA Tobacco/alcohol: 1/2ppd, no etoh Illicit drugs: no history of illicit drug use  The following portions of the patient's history were reviewed and updated as appropriate: allergies, current medications, past family history, past medical history, past social history, past surgical history and problem list.  Past Medical History Past Medical History  Diagnosis Date  . UTI (lower urinary tract infection)   . Pelvic congestion syndrome   . Abnormal Pap smear of cervix     Past Surgical History Past Surgical History  Procedure Laterality Date  . Colposcopy  04/2011    Gynecologic History No obstetric history on file.  Patient's last menstrual period was 05/08/2014. Contraception: ortho evra since 2011, needs refill.  Last Pap: 2014- [facility]. Results were: abnormal, states last 4 paps have been abnormal, +HRHPV, had neg colpo 2012 Last  mammogram: never. Results were: n/a Last TCS: never  Obstetric History OB History  No data available    Current Medications Current Outpatient Prescriptions on File Prior to Visit  Medication Sig Dispense Refill  . ibuprofen (ADVIL,MOTRIN) 200 MG tablet Take 800-1,000 mg by mouth every 6 (six) hours as needed for moderate pain.      Marland Kitchen. norelgestromin-ethinyl estradiol (ORTHO EVRA) 150-20 MCG/24HR transdermal patch Place 1 patch onto the skin once a week.      . ciprofloxacin (CIPRO) 500 MG tablet Take 1 tablet (500 mg total) by mouth 2 (two) times daily.  20 tablet  0  . oxyCODONE-acetaminophen (PERCOCET/ROXICET) 5-325 MG per tablet Take 1 tablet by mouth every 4 (four) hours as needed for moderate pain or severe pain.      . promethazine (PHENERGAN) 25 MG tablet Take 25 mg by mouth every 6 (six) hours as needed for nausea or vomiting.      . sulfamethoxazole-trimethoprim (SEPTRA DS) 800-160 MG per tablet Take 1 tablet by mouth every 12 (twelve) hours.  20 tablet  0   No current facility-administered medications on file prior to visit.    Review of Systems Patient denies any headaches, blurred vision, shortness of breath, chest pain, abdominal pain, problems with bowel movements, urination, or intercourse.  Objective:  BP 118/72  Ht 5' 6.25" (1.683 m)  Wt 139 lb (63.05 kg)  BMI 22.26 kg/m2  LMP 05/08/2014 Physical Exam  General:  Well developed, well nourished, no acute distress. She is alert and oriented x3. Skin:  Warm and dry Neck:  Midline trachea,  no thyromegaly or nodules Cardiovascular: Regular rate and rhythm, no murmur heard Lungs:  Effort normal, all lung fields clear to auscultation bilaterally Breasts:  No dominant palpable mass, retraction, or nipple discharge Abdomen:  Soft, non tender, no hepatosplenomegaly or masses Pelvic:  External genitalia is normal in appearance.  The vagina is normal in appearance. The cervix is bulbous, no CMT.  Thin prep pap is done w/  reflex HR HPV cotesting. Uterus is felt to be normal size, shape, and contour.  No adnexal masses or tenderness noted. Extremities:  No swelling or varicosities noted Psych:  She has a normal mood and affect  Assessment:   Healthy well-woman exam Smoker Migraines w/ what sounds like aura H/O recurrent utis/pyelo H/O pelvic congestion syndrome  Plan:  I do not feel comfortable rx'ing her combined birth control d/t migraines w/ what sounds like aura to me, offered her progestin-only methods of which she declined all, recommended seeing neurologist to assess if she is indeed having aura before I will refill ortho evra GC/CT from today's pap F/U this week for fasting am labs as requested: CBC, CMP, TSH, Lipid panel, w/ STI screening: HIV, RPR, HSV2, HepB & HepC, then 60yr for physical, or sooner if needed Do not currently have referral form for [facility] [place], will get some more and she can sign when she returns for labs Mammogram @28yo  or sooner if problems Colonoscopy @28yo  or sooner if problems  Marge Duncans CNM, [facility] 06/05/2014 3:16 PM

## 2014-06-06 ENCOUNTER — Other Ambulatory Visit: Payer: Medicaid Other

## 2014-06-06 DIAGNOSIS — Z1159 Encounter for screening for other viral diseases: Secondary | ICD-10-CM

## 2014-06-06 DIAGNOSIS — Z01419 Encounter for gynecological examination (general) (routine) without abnormal findings: Secondary | ICD-10-CM

## 2014-06-06 DIAGNOSIS — Z113 Encounter for screening for infections with a predominantly sexual mode of transmission: Secondary | ICD-10-CM

## 2014-06-06 LAB — CBC
HEMATOCRIT: 38.6 % (ref 36.0–46.0)
HEMOGLOBIN: 13.6 g/dL (ref 12.0–15.0)
MCH: 29.9 pg (ref 26.0–34.0)
MCHC: 35.2 g/dL (ref 30.0–36.0)
MCV: 84.8 fL (ref 78.0–100.0)
Platelets: 196 10*3/uL (ref 150–400)
RBC: 4.55 MIL/uL (ref 3.87–5.11)
RDW: 13.4 % (ref 11.5–15.5)
WBC: 7 10*3/uL (ref 4.0–10.5)

## 2014-06-07 ENCOUNTER — Telehealth: Payer: Self-pay | Admitting: Women's Health

## 2014-06-07 LAB — COMPREHENSIVE METABOLIC PANEL
ALT: 11 U/L (ref 0–35)
AST: 14 U/L (ref 0–37)
Albumin: 4.3 g/dL (ref 3.5–5.2)
Alkaline Phosphatase: 45 U/L (ref 39–117)
BUN: 12 mg/dL (ref 6–23)
CALCIUM: 9.8 mg/dL (ref 8.4–10.5)
CHLORIDE: 105 meq/L (ref 96–112)
CO2: 26 meq/L (ref 19–32)
Creat: 0.69 mg/dL (ref 0.50–1.10)
Glucose, Bld: 88 mg/dL (ref 70–99)
POTASSIUM: 4.3 meq/L (ref 3.5–5.3)
SODIUM: 139 meq/L (ref 135–145)
TOTAL PROTEIN: 7.4 g/dL (ref 6.0–8.3)
Total Bilirubin: 0.4 mg/dL (ref 0.2–1.2)

## 2014-06-07 LAB — LIPID PANEL
CHOL/HDL RATIO: 4.7 ratio
CHOLESTEROL: 206 mg/dL — AB (ref 0–200)
HDL: 44 mg/dL (ref >39)
LDL Cholesterol: 126 mg/dL — ABNORMAL HIGH (ref 0–99)
Triglycerides: 178 mg/dL — ABNORMAL HIGH (ref <150)
VLDL: 36 mg/dL (ref 0–40)

## 2014-06-07 LAB — HSV 2 ANTIBODY, IGG: HSV 2 Glycoprotein G Ab, IgG: <0.1 IV

## 2014-06-07 LAB — HEPATITIS B SURFACE ANTIGEN: HEP B S AG: NEGATIVE

## 2014-06-07 LAB — CYTOLOGY - PAP

## 2014-06-07 LAB — HIV ANTIBODY (ROUTINE TESTING W REFLEX): HIV 1&2 Ab, 4th Generation: NONREACTIVE

## 2014-06-07 LAB — RPR

## 2014-06-07 LAB — HEPATITIS C ANTIBODY: HCV Ab: NEGATIVE

## 2014-06-07 LAB — TSH: TSH: 2.605 u[IU]/mL (ref 0.350–4.500)

## 2014-06-07 NOTE — Telephone Encounter (Signed)
Notified pt of labs, including increased lipid panel: recommended smoking cessation, bmi is 22 so she is a good weight, to increase aerobic exercise, avoid concentrated sugars, substitute monounsaturated fats for saturated fats- recheck in ~626mths. Also notified of neg pap w/ +HRHPV- recommended re-pap in 4353yr. She can come by any time to sign the [facility] referral form for smoking cessation.  Cheral MarkerKimberly R. Booker, CNM, [facility]NP-BC 06/07/2014 4:51 PM

## 2014-11-01 ENCOUNTER — Emergency Department (HOSPITAL_COMMUNITY): Payer: Medicaid Other

## 2014-11-01 ENCOUNTER — Emergency Department (HOSPITAL_COMMUNITY)
Admission: EM | Admit: 2014-11-01 | Discharge: 2014-11-01 | Disposition: A | Discharge status: Home / Self Care | Payer: Medicaid Other | Attending: Emergency Medicine | Admitting: Emergency Medicine

## 2014-11-01 ENCOUNTER — Encounter (HOSPITAL_COMMUNITY): Payer: Self-pay | Admitting: *Deleted

## 2014-11-01 DIAGNOSIS — Y999 Unspecified external cause status: Secondary | ICD-10-CM | POA: Insufficient documentation

## 2014-11-01 DIAGNOSIS — Z792 Long term (current) use of antibiotics: Secondary | ICD-10-CM | POA: Insufficient documentation

## 2014-11-01 DIAGNOSIS — S0990XA Unspecified injury of head, initial encounter: Secondary | ICD-10-CM | POA: Diagnosis present

## 2014-11-01 DIAGNOSIS — Z720 Tobacco use: Secondary | ICD-10-CM | POA: Insufficient documentation

## 2014-11-01 DIAGNOSIS — Z87440 Personal history of urinary (tract) infections: Secondary | ICD-10-CM | POA: Insufficient documentation

## 2014-11-01 DIAGNOSIS — Y92410 Unspecified street and highway as the place of occurrence of the external cause: Secondary | ICD-10-CM | POA: Insufficient documentation

## 2014-11-01 DIAGNOSIS — Y939 Activity, unspecified: Secondary | ICD-10-CM | POA: Insufficient documentation

## 2014-11-01 DIAGNOSIS — S0101XA Laceration without foreign body of scalp, initial encounter: Secondary | ICD-10-CM | POA: Diagnosis not present

## 2014-11-01 DIAGNOSIS — Z8742 Personal history of other diseases of the female genital tract: Secondary | ICD-10-CM | POA: Diagnosis not present

## 2014-11-01 MED ORDER — LIDOCAINE-EPINEPHRINE (PF) 1 %-1:200000 IJ SOLN
10.0000 mL | Freq: Once | INTRAMUSCULAR | Status: AC
  Administered 2014-11-01: 18:00:00
  Filled 2014-11-01: qty 10

## 2014-11-01 MED ORDER — HYDROCODONE-ACETAMINOPHEN 5-325 MG PO TABS: 2.0000 | ORAL_TABLET | ORAL | Status: DC | PRN

## 2014-11-01 MED ORDER — LIDOCAINE-EPINEPHRINE (PF) 1 %-1:200000 IJ SOLN
INTRAMUSCULAR | Status: AC
  Filled 2014-11-01: qty 10

## 2014-11-01 MED ORDER — HYDROCODONE-ACETAMINOPHEN 5-325 MG PO TABS
1.0000 | ORAL_TABLET | Freq: Once | ORAL | Status: AC
  Administered 2014-11-01: 1 via ORAL
  Filled 2014-11-01: qty 1

## 2014-11-01 MED ORDER — ONDANSETRON 4 MG PO TBDP
4.0000 mg | ORAL_TABLET | Freq: Once | ORAL | Status: AC
  Administered 2014-11-01: 4 mg via ORAL
  Filled 2014-11-01: qty 1

## 2014-11-01 NOTE — ED Notes (Signed)
Pt cleared from backboard with assistance of Dr. Fayrene FearingJames and Genia PlantsJoanna Keith,RN. C-collar remains in place.
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
## 2014-11-01 NOTE — Discharge Instructions (Signed)
Sutures out in 10 days. Return to ER with any worsening symptoms.  Head Injury You have received a head injury. It does not appear serious at this time. Headaches and vomiting are common following head injury. It should be easy to awaken from sleeping. Sometimes it is necessary for you to stay in the emergency department for a while for observation. Sometimes admission to the hospital may be needed. After injuries such as yours, most problems occur within the first 24 hours, but side effects may occur up to 7-10 days after the injury. It is important for you to carefully monitor your condition and contact your health care provider or seek immediate medical care if there is a change in your condition. WHAT ARE THE TYPES OF HEAD INJURIES? Head injuries can be as minor as a bump. Some head injuries can be more severe. More severe head injuries include:  A jarring injury to the brain (concussion).  A bruise of the brain (contusion). This mean there is bleeding in the brain that can cause swelling.  A cracked skull (skull fracture).  Bleeding in the brain that collects, clots, and forms a bump (hematoma). WHAT CAUSES A HEAD INJURY? A serious head injury is most likely to happen to someone who is in a car wreck and is not wearing a seat belt. Other causes of major head injuries include bicycle or motorcycle accidents, sports injuries, and falls. HOW ARE HEAD INJURIES DIAGNOSED? A complete history of the event leading to the injury and your current symptoms will be helpful in diagnosing head injuries. Many times, pictures of the brain, such as CT or MRI are needed to see the extent of the injury. Often, an overnight hospital stay is necessary for observation.  WHEN SHOULD I SEEK IMMEDIATE MEDICAL CARE?  You should get help right away if:  You have confusion or drowsiness.  You feel sick to your stomach (nauseous) or have continued, forceful vomiting.  You have dizziness or unsteadiness that is  getting worse.  You have severe, continued headaches not relieved by medicine. Only take over-the-counter or prescription medicines for pain, fever, or discomfort as directed by your health care provider.  You do not have normal function of the arms or legs or are unable to walk.  You notice changes in the black spots in the center of the colored part of your eye (pupil).  You have a clear or bloody fluid coming from your nose or ears.  You have a loss of vision. During the next 24 hours after the injury, you must stay with someone who can watch you for the warning signs. This person should contact local emergency services (911 in the [place] if you have seizures, you become unconscious, or you are unable to wake up. HOW CAN I PREVENT A HEAD INJURY IN THE FUTURE? The most important factor for preventing major head injuries is avoiding motor vehicle accidents. To minimize the potential for damage to your head, it is crucial to wear seat belts while riding in motor vehicles. Wearing helmets while bike riding and playing collision sports (like football) is also helpful. Also, avoiding dangerous activities around the house will further help reduce your risk of head injury.  WHEN CAN I RETURN TO NORMAL ACTIVITIES AND ATHLETICS? You should be reevaluated by your health care provider before returning to these activities. If you have any of the following symptoms, you should not return to activities or contact sports until 1 week after the symptoms have stopped:  Persistent  headache.  Dizziness or vertigo.  Poor attention and concentration.  Confusion.  Memory problems.  Nausea or vomiting.  Fatigue or tire easily.  Irritability.  Intolerant of bright lights or loud noises.  Anxiety or depression.  Disturbed sleep. MAKE SURE YOU:   Understand these instructions.  Will watch your condition.  Will get help right away if you are not doing well or get worse. Document Released:  08/18/2005 Document Revised: 08/23/2013 Document Reviewed: 04/25/2013 [facility] Patient Information 2015 [place], [place]. This information is not intended to replace advice given to you by your health care provider. Make sure you discuss any questions you have with your health care provider.  Laceration Care, Adult A laceration is a cut or lesion that goes through all layers of the skin and into the tissue just beneath the skin. TREATMENT  Some lacerations may not require closure. Some lacerations may not be able to be closed due to an increased risk of infection. It is important to see your caregiver as soon as possible after an injury to minimize the risk of infection and maximize the opportunity for successful closure. If closure is appropriate, pain medicines may be given, if needed. The wound will be cleaned to help prevent infection. Your caregiver will use stitches (sutures), staples, wound glue (adhesive), or skin adhesive strips to repair the laceration. These tools bring the skin edges together to allow for faster healing and a better cosmetic outcome. However, all wounds will heal with a scar. Once the wound has healed, scarring can be minimized by covering the wound with sunscreen during the day for 1 full year. HOME CARE INSTRUCTIONS  For sutures or staples:  Keep the wound clean and dry.  If you were given a bandage (dressing), you should change it at least once a day. Also, change the dressing if it becomes wet or dirty, or as directed by your caregiver.  Wash the wound with soap and water 2 times a day. Rinse the wound off with water to remove all soap. Pat the wound dry with a clean towel.  After cleaning, apply a thin layer of the antibiotic ointment as recommended by your caregiver. This will help prevent infection and keep the dressing from sticking.  You may shower as usual after the first 24 hours. Do not soak the wound in water until the sutures are removed.  Only take  over-the-counter or prescription medicines for pain, discomfort, or fever as directed by your caregiver.  Get your sutures or staples removed as directed by your caregiver. For skin adhesive strips:  Keep the wound clean and dry.  Do not get the skin adhesive strips wet. You may bathe carefully, using caution to keep the wound dry.  If the wound gets wet, pat it dry with a clean towel.  Skin adhesive strips will fall off on their own. You may trim the strips as the wound heals. Do not remove skin adhesive strips that are still stuck to the wound. They will fall off in time. For wound adhesive:  You may briefly wet your wound in the shower or bath. Do not soak or scrub the wound. Do not swim. Avoid periods of heavy perspiration until the skin adhesive has fallen off on its own. After showering or bathing, gently pat the wound dry with a clean towel.  Do not apply liquid medicine, cream medicine, or ointment medicine to your wound while the skin adhesive is in place. This may loosen the film before your wound is  healed.  If a dressing is placed over the wound, be careful not to apply tape directly over the skin adhesive. This may cause the adhesive to be pulled off before the wound is healed.  Avoid prolonged exposure to sunlight or tanning lamps while the skin adhesive is in place. Exposure to ultraviolet light in the first year will darken the scar.  The skin adhesive will usually remain in place for 5 to 10 days, then naturally fall off the skin. Do not pick at the adhesive film. You may need a tetanus shot if:  You cannot remember when you had your last tetanus shot.  You have never had a tetanus shot. If you get a tetanus shot, your arm may swell, get red, and feel warm to the touch. This is common and not a problem. If you need a tetanus shot and you choose not to have one, there is a rare chance of getting tetanus. Sickness from tetanus can be serious. SEEK MEDICAL CARE IF:   You  have redness, swelling, or increasing pain in the wound.  You see a red line that goes away from the wound.  You have yellowish-white fluid (pus) coming from the wound.  You have a fever.  You notice a bad smell coming from the wound or dressing.  Your wound breaks open before or after sutures have been removed.  You notice something coming out of the wound such as wood or glass.  Your wound is on your hand or foot and you cannot move a finger or toe. SEEK IMMEDIATE MEDICAL CARE IF:   Your pain is not controlled with prescribed medicine.  You have severe swelling around the wound causing pain and numbness or a change in color in your arm, hand, leg, or foot.  Your wound splits open and starts bleeding.  You have worsening numbness, weakness, or loss of function of any joint around or beyond the wound.  You develop painful lumps near the wound or on the skin anywhere on your body. MAKE SURE YOU:   Understand these instructions.  Will watch your condition.  Will get help right away if you are not doing well or get worse. Document Released: 08/18/2005 Document Revised: 11/10/2011 Document Reviewed: 02/11/2011 [facility] Patient Information 2015 [place]. This information is not intended to replace advice given to you by your health care provider. Make sure you discuss any questions you have with your health care provider.
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
## 2014-12-04 ENCOUNTER — Other Ambulatory Visit (HOSPITAL_COMMUNITY): Payer: Self-pay | Admitting: Physician Assistant

## 2014-12-04 DIAGNOSIS — G44329 Chronic post-traumatic headache, not intractable: Secondary | ICD-10-CM

## 2014-12-05 ENCOUNTER — Ambulatory Visit (HOSPITAL_COMMUNITY)
Admission: RE | Admit: 2014-12-05 | Discharge: 2014-12-05 | Disposition: A | Discharge status: Home / Self Care | Payer: Medicaid Other | Source: Ambulatory Visit | Attending: Physician Assistant | Admitting: Physician Assistant

## 2014-12-05 DIAGNOSIS — G44329 Chronic post-traumatic headache, not intractable: Secondary | ICD-10-CM | POA: Insufficient documentation

## 2015-02-27 IMAGING — CR DG CHEST 2V
2 series · 2 of 2 positions shown · non-contrast
Comparison: None

CLINICAL DATA: RIGHT flank pain, smoker

EXAM:
CHEST  2 VIEW

[w chest pa]
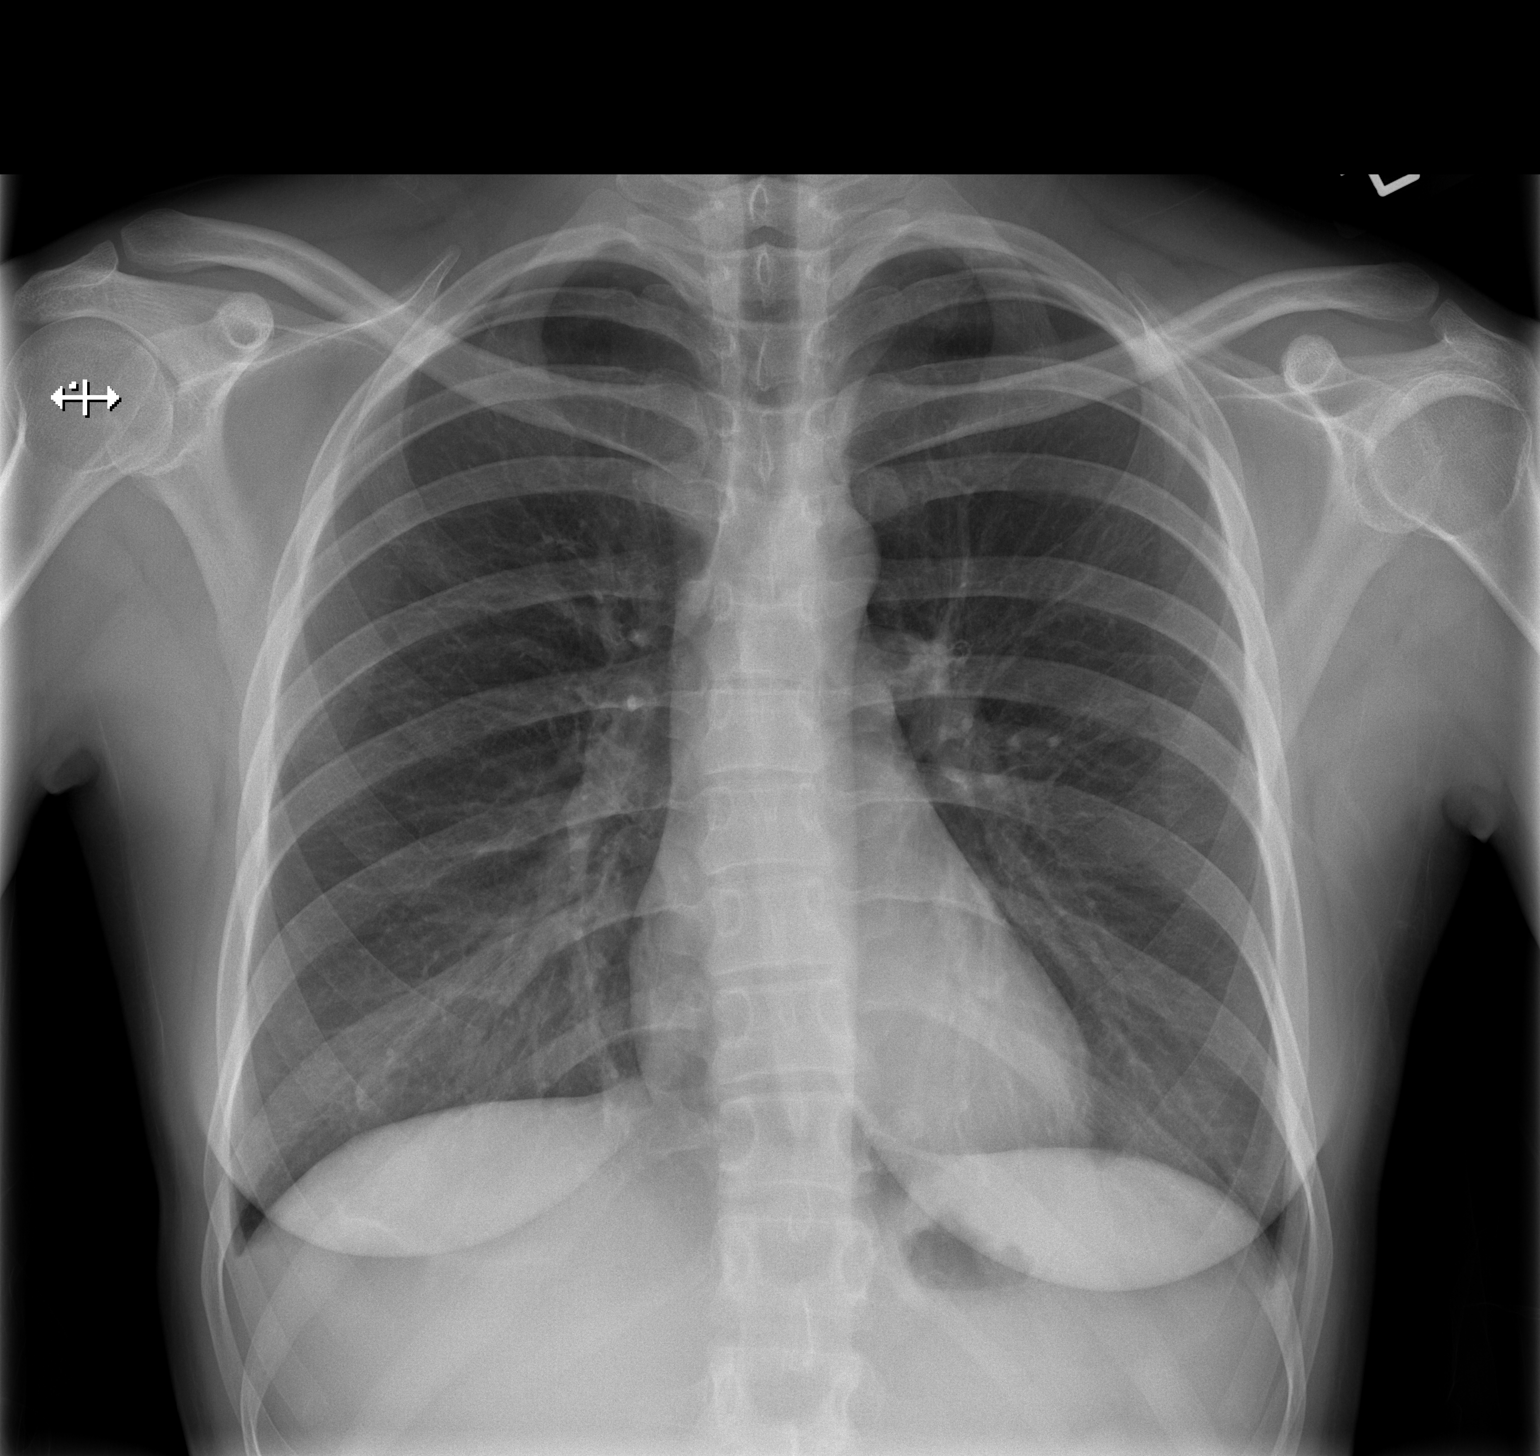

[w chest lat]
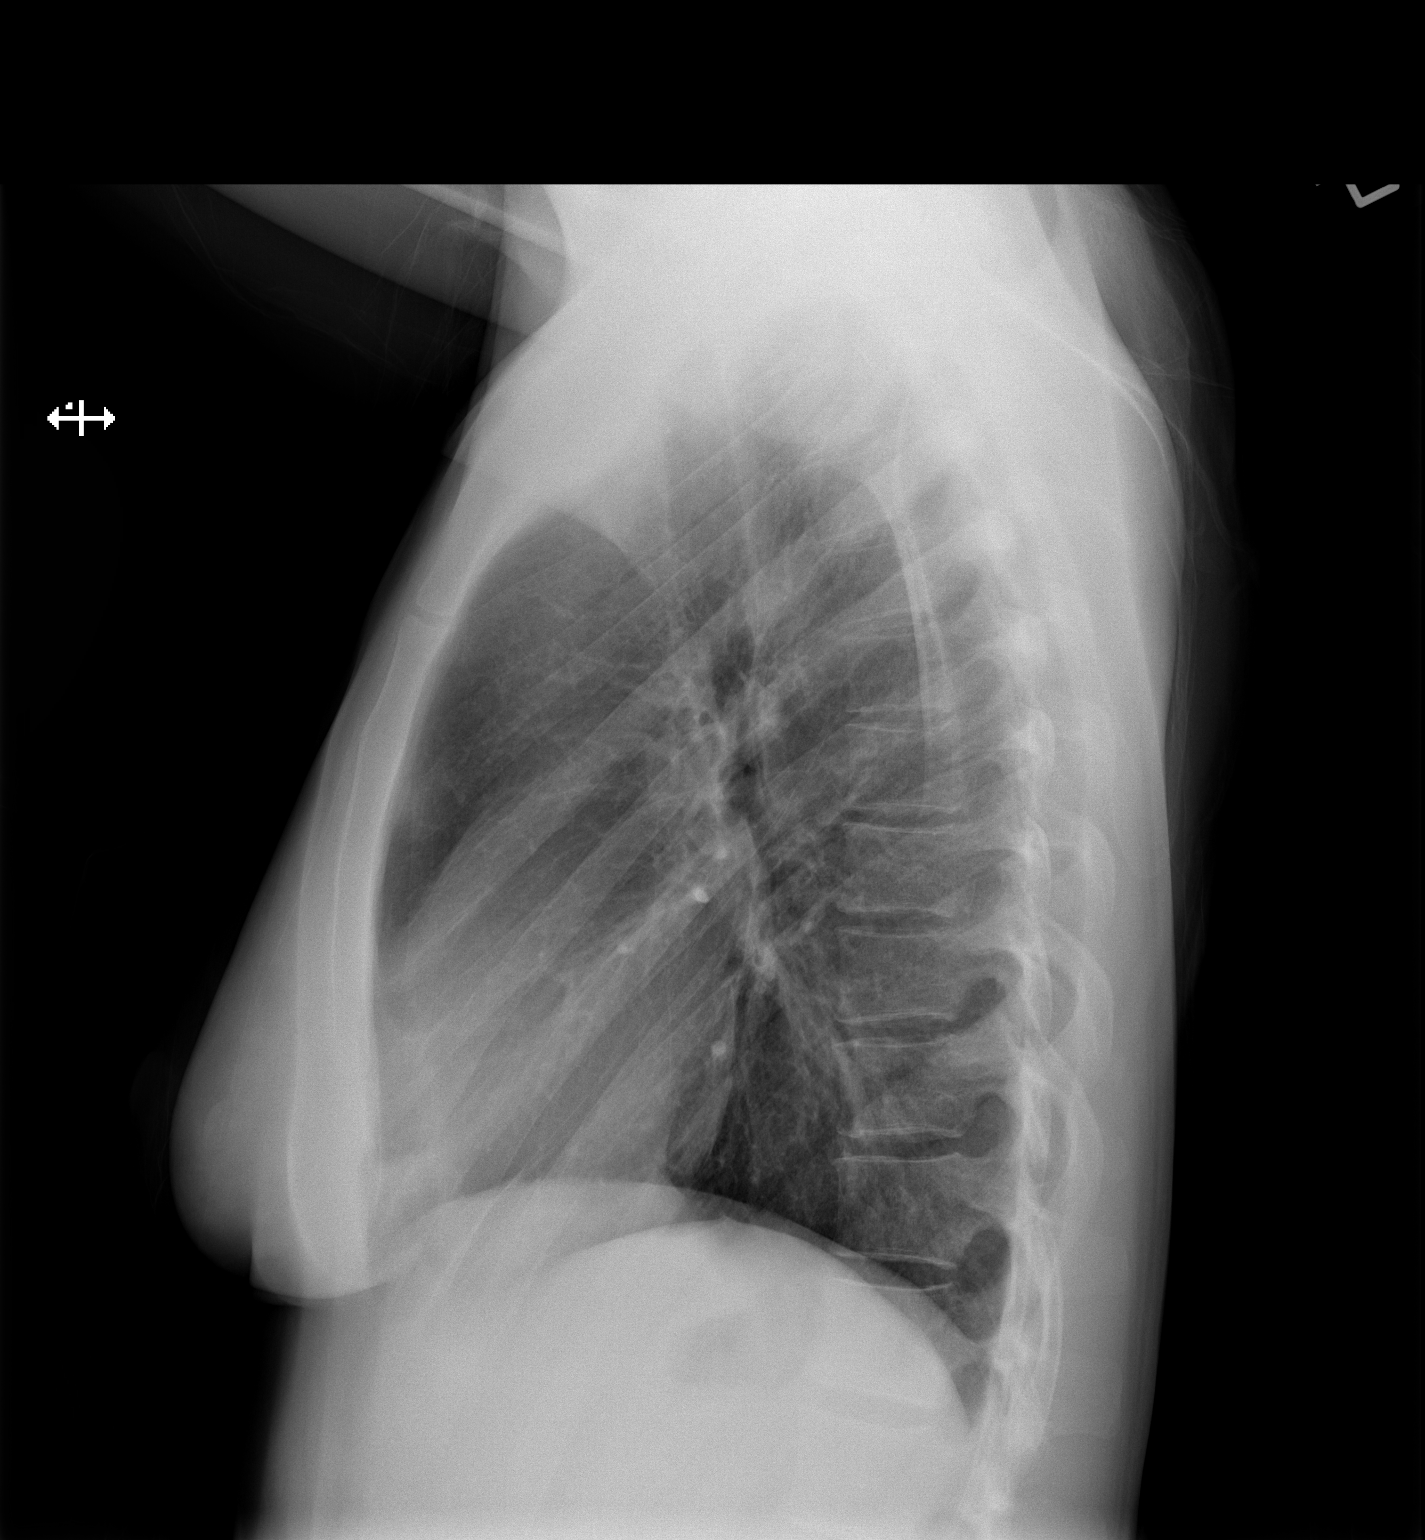

[2 of 2 positions shown; findings below may reference images not displayed]

FINDINGS: Normal heart size, mediastinal contours, and pulmonary vascularity.

Mild peribronchial thickening.

No pulmonary infiltrate, pleural effusion, or pneumothorax.

RIGHT nipple shadow.

Bones unremarkable.
IMPRESSION: Mild bronchitic changes without infiltrate.

## 2015-03-08 ENCOUNTER — Ambulatory Visit (INDEPENDENT_AMBULATORY_CARE_PROVIDER_SITE_OTHER): Payer: Medicaid Other | Admitting: Adult Health

## 2015-03-08 ENCOUNTER — Encounter: Payer: Self-pay | Admitting: Adult Health

## 2015-03-08 VITALS — BP 120/70 | HR 100 | Ht 68.0 in | Wt 139.0 lb

## 2015-03-08 DIAGNOSIS — R1032 Left lower quadrant pain: Secondary | ICD-10-CM | POA: Diagnosis not present

## 2015-03-08 DIAGNOSIS — N926 Irregular menstruation, unspecified: Secondary | ICD-10-CM | POA: Diagnosis not present

## 2015-03-08 DIAGNOSIS — Z3202 Encounter for pregnancy test, result negative: Secondary | ICD-10-CM

## 2015-03-08 DIAGNOSIS — Z30011 Encounter for initial prescription of contraceptive pills: Secondary | ICD-10-CM

## 2015-03-08 DIAGNOSIS — Z309 Encounter for contraceptive management, unspecified: Secondary | ICD-10-CM

## 2015-03-08 HISTORY — DX: Left lower quadrant pain: R10.32

## 2015-03-08 HISTORY — DX: Encounter for contraceptive management, unspecified: Z30.9

## 2015-03-08 HISTORY — DX: Irregular menstruation, unspecified: N92.6

## 2015-03-08 LAB — POCT URINE PREGNANCY: Preg Test, Ur: NEGATIVE

## 2015-03-08 MED ORDER — NORETHINDRONE 0.35 MG PO TABS: 1.0000 | ORAL_TABLET | Freq: Every day | ORAL | Status: DC

## 2015-03-08 NOTE — Progress Notes (Signed)
Subjective:     Patient ID: Shirley Mendez, female   DOB: 11/09/1985, 29 y.o.   MRN: [id number]  HPI Shirley Mendez is a 29 year old white female in complaining of pain in LLQ and back and bleeding for 9 days, she was having BTB with patch and stopped it about a week ago.She has migraines with aura.Has no new sex partners.  Review of Systems  Patient denies any hearing loss, fatigue, blurred vision, shortness of breath, chest pain, abdominal pain, problems with bowel movements, urination, or intercourse. No joint pain or mood swings.See HPI for positives.  Reviewed past medical,surgical, social and family history. Reviewed medications and allergies.     Objective:   Physical Exam BP 120/70 mmHg  Pulse 100  Ht 5\' 8"  (1.727 m)  Wt 139 lb (63.05 kg)  BMI 21.14 kg/m2  LMP 02/11/2015 UPT negative, Skin warm and dry. Neck: mid line trachea, normal thyroid, good ROM, no lymphadenopathy noted. Lungs: clear to ausculation bilaterally. Cardiovascular: regular rate and rhythm.Pelvic: external genitalia is normal in appearance no lesions, vagina: period like blood without odor,urethra has no lesions or masses noted, cervix:smooth and bulbous, NO CMT uterus: normal size, shape and contour, mildly tender, no masses felt, adnexa: no masses, LLQ  tenderness noted. Bladder is non tender and no masses felt. GC/CHL obtained.    Discussed getting US and starting POP and she agrees.  Assessment:     Irregular bleeding LLQ pain Contraceptive management     Plan:     GC/CHL sent Return in 1 week for gyn US Rx micronor disp 1 pack take 1 daily with 11 refills, start Sunday Review handout on pelvic pain Decrease cigarettes Use condoms

## 2015-03-08 NOTE — Patient Instructions (Signed)
Return in 1 week for [place]  Start mirconor  Sunday Use condoms Pelvic Pain Female pelvic pain can be caused by many different things and start from a variety of places. Pelvic pain refers to pain that is located in the lower half of the abdomen and between your hips. The pain may occur over a short period of time (acute) or may be reoccurring (chronic). The cause of pelvic pain may be related to disorders affecting the female reproductive organs (gynecologic), but it may also be related to the bladder, kidney stones, an intestinal complication, or muscle or skeletal problems. Getting help right away for pelvic pain is important, especially if there has been severe, sharp, or a sudden onset of unusual pain. It is also important to get help right away because some types of pelvic pain can be life threatening.  CAUSES  Below are only some of the causes of pelvic pain. The causes of pelvic pain can be in one of several categories.   Gynecologic.  Pelvic inflammatory disease.  Sexually transmitted infection.  Ovarian cyst or a twisted ovarian ligament (ovarian torsion).  Uterine lining that grows outside the uterus (endometriosis).  Fibroids, cysts, or tumors.  Ovulation.  Pregnancy.  Pregnancy that occurs outside the uterus (ectopic pregnancy).  Miscarriage.  Labor.  Abruption of the placenta or ruptured uterus.  Infection.  Uterine infection (endometritis).  Bladder infection.  Diverticulitis.  Miscarriage related to a uterine infection (septic abortion).  Bladder.  Inflammation of the bladder (cystitis).  Kidney stone(s).  Gastrointestinal.  Constipation.  Diverticulitis.  Neurologic.  Trauma.  Feeling pelvic pain because of mental or emotional causes (psychosomatic).  Cancers of the bowel or pelvis. EVALUATION  Your caregiver will want to take a careful history of your concerns. This includes recent changes in your health, a careful gynecologic history of  your periods (menses), and a sexual history. Obtaining your family history and medical history is also important. Your caregiver may suggest a pelvic exam. A pelvic exam will help identify the location and severity of the pain. It also helps in the evaluation of which organ system may be involved. In order to identify the cause of the pelvic pain and be properly treated, your caregiver may order tests. These tests may include:   A pregnancy test.  Pelvic ultrasonography.  An X-ray exam of the abdomen.  A urinalysis or evaluation of vaginal discharge.  Blood tests. HOME CARE INSTRUCTIONS   Only take over-the-counter or prescription medicines for pain, discomfort, or fever as directed by your caregiver.   Rest as directed by your caregiver.   Eat a balanced diet.   Drink enough fluids to make your urine clear or pale yellow, or as directed.   Avoid sexual intercourse if it causes pain.   Apply warm or cold compresses to the lower abdomen depending on which one helps the pain.   Avoid stressful situations.   Keep a journal of your pelvic pain. Write down when it started, where the pain is located, and if there are things that seem to be associated with the pain, such as food or your menstrual cycle.  Follow up with your caregiver as directed.  SEEK MEDICAL CARE IF:  Your medicine does not help your pain.  You have abnormal vaginal discharge. SEEK IMMEDIATE MEDICAL CARE IF:   You have heavy bleeding from the vagina.   Your pelvic pain increases.   You feel light-headed or faint.   You have chills.   You have pain  with urination or blood in your urine.   You have uncontrolled diarrhea or vomiting.   You have a fever or persistent symptoms for more than 3 days.  You have a fever and your symptoms suddenly get worse.   You are being physically or sexually abused.  MAKE SURE YOU:  Understand these instructions.  Will watch your condition.  Will get  help if you are not doing well or get worse. Document Released: 07/15/2004 Document Revised: 01/02/2014 Document Reviewed: 12/08/2011 [facility] Patient Information 2015 [place], [place]. This information is not intended to replace advice given to you by your health care provider. Make sure you discuss any questions you have with your health care provider.

## 2015-03-09 LAB — GC/CHLAMYDIA PROBE AMP
Chlamydia trachomatis, NAA: NEGATIVE
Neisseria gonorrhoeae by PCR: NEGATIVE

## 2015-03-14 ENCOUNTER — Other Ambulatory Visit: Payer: Medicaid Other

## 2015-03-15 ENCOUNTER — Other Ambulatory Visit: Payer: Medicaid Other

## 2015-03-15 ENCOUNTER — Encounter: Payer: Self-pay | Admitting: Obstetrics & Gynecology

## 2015-07-19 ENCOUNTER — Encounter: Payer: Self-pay | Admitting: Adult Health

## 2015-07-19 ENCOUNTER — Ambulatory Visit (INDEPENDENT_AMBULATORY_CARE_PROVIDER_SITE_OTHER): Payer: Medicaid Other | Admitting: Adult Health

## 2015-07-19 VITALS — BP 130/70 | HR 80 | Ht 68.0 in | Wt 144.2 lb

## 2015-07-19 DIAGNOSIS — N926 Irregular menstruation, unspecified: Secondary | ICD-10-CM | POA: Insufficient documentation

## 2015-07-19 DIAGNOSIS — Z3202 Encounter for pregnancy test, result negative: Secondary | ICD-10-CM

## 2015-07-19 DIAGNOSIS — Z3041 Encounter for surveillance of contraceptive pills: Secondary | ICD-10-CM

## 2015-07-19 HISTORY — DX: Irregular menstruation, unspecified: N92.6

## 2015-07-19 LAB — POCT URINE PREGNANCY: Preg Test, Ur: NEGATIVE

## 2015-07-19 NOTE — Patient Instructions (Signed)
Take Micronor daily at same Follow up prn

## 2015-07-19 NOTE — Progress Notes (Signed)
Subjective:     Patient ID: Shirley Mendez, female   DOB: 12-Jan-1986, 29 y.o.   MRN: [id number]  HPI [name] is a [age] year old white female in for having missed several periods on Micronor and she has gained about 5 lbs.She works for [employer] in [employer] and going to nursing school wants to be Soil scientistflight nurse.  Review of Systems Patient denies any headaches, hearing loss, fatigue, blurred vision, shortness of breath, chest pain, abdominal pain, problems with bowel movements, urination, or intercourse. No joint pain or mood swings.+missed period on OCs Reviewed past medical,surgical, social and family history. Reviewed medications and allergies.     Objective:   Physical Exam BP 130/70 mmHg  Pulse 80  Ht 5\' 8"  (1.727 m)  Wt 144 lb 3.2 oz (65.409 kg)  BMI 21.93 kg/m2 UPT negative,Skin warm and dry. Lungs: clear to ausculation bilaterally. Cardiovascular: regular rate and rhythm.Discussed it is OK to miss a period when on birth control pills,make sure takes every day at same time every day.Reassured on weight,decrease carbs.    Assessment:     Missed periods Contraceptive management    Plan:     Continue Micronor Follow up prn

## 2015-09-23 ENCOUNTER — Encounter (HOSPITAL_COMMUNITY): Payer: Self-pay | Admitting: *Deleted

## 2015-09-23 ENCOUNTER — Emergency Department (HOSPITAL_COMMUNITY): Payer: Worker's Compensation

## 2015-09-23 ENCOUNTER — Emergency Department (HOSPITAL_COMMUNITY)
Admission: EM | Admit: 2015-09-23 | Discharge: 2015-09-23 | Disposition: A | Discharge status: Home / Self Care | Payer: Worker's Compensation | Attending: Emergency Medicine | Admitting: Emergency Medicine

## 2015-09-23 DIAGNOSIS — Z793 Long term (current) use of hormonal contraceptives: Secondary | ICD-10-CM | POA: Diagnosis not present

## 2015-09-23 DIAGNOSIS — X501XXA Overexertion from prolonged static or awkward postures, initial encounter: Secondary | ICD-10-CM | POA: Insufficient documentation

## 2015-09-23 DIAGNOSIS — Y9289 Other specified places as the place of occurrence of the external cause: Secondary | ICD-10-CM | POA: Insufficient documentation

## 2015-09-23 DIAGNOSIS — Y9389 Activity, other specified: Secondary | ICD-10-CM | POA: Diagnosis not present

## 2015-09-23 DIAGNOSIS — Z8739 Personal history of other diseases of the musculoskeletal system and connective tissue: Secondary | ICD-10-CM | POA: Insufficient documentation

## 2015-09-23 DIAGNOSIS — Y998 Other external cause status: Secondary | ICD-10-CM | POA: Insufficient documentation

## 2015-09-23 DIAGNOSIS — S99912A Unspecified injury of left ankle, initial encounter: Secondary | ICD-10-CM | POA: Diagnosis present

## 2015-09-23 DIAGNOSIS — S93402A Sprain of unspecified ligament of left ankle, initial encounter: Secondary | ICD-10-CM

## 2015-09-23 DIAGNOSIS — Z8679 Personal history of other diseases of the circulatory system: Secondary | ICD-10-CM | POA: Insufficient documentation

## 2015-09-23 DIAGNOSIS — Z87440 Personal history of urinary (tract) infections: Secondary | ICD-10-CM | POA: Diagnosis not present

## 2015-09-23 DIAGNOSIS — S93492A Sprain of other ligament of left ankle, initial encounter: Secondary | ICD-10-CM | POA: Diagnosis not present

## 2015-09-23 DIAGNOSIS — Z8742 Personal history of other diseases of the female genital tract: Secondary | ICD-10-CM | POA: Diagnosis not present

## 2015-09-23 DIAGNOSIS — F17210 Nicotine dependence, cigarettes, uncomplicated: Secondary | ICD-10-CM | POA: Insufficient documentation

## 2015-09-23 MED ORDER — IBUPROFEN 800 MG PO TABS: 800.0000 mg | ORAL_TABLET | Freq: Three times a day (TID) | ORAL | Status: DC

## 2015-09-23 NOTE — ED Provider Notes (Signed)
CSN: [id number]     Arrival date & time 09/23/15  2002 History   First MD Initiated Contact with Patient 09/23/15 2024     Chief Complaint  Patient presents with  . 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 Patient is a paramedic. Denies any other injury. Did not hit head or lose consciousness. No focal weakness, numbness or tingling. Took ibuprofen without relief. No neck or back pain. No proximal fibular pain.  Patient is a 30 y.o. female presenting with lower extremity injury. The history is provided by the patient.  Ankle Injury Pertinent negatives include no chest pain, no abdominal pain, no headaches and no shortness of breath.    Past Medical History  Diagnosis Date  . UTI (lower urinary tract infection)   . Pelvic congestion syndrome   . Abnormal Pap smear of cervix   . Irregular bleeding 03/08/2015  . LLQ pain 03/08/2015  . Contraceptive management 03/08/2015  . Migraine with aura   . Hyperlipidemia   . Missed periods 07/19/2015   Past Surgical History  Procedure Laterality Date  . Colposcopy  04/2011   Family History  Problem Relation Age of Onset  . Asthma Son   . Depression Maternal Grandmother   . Heart disease Maternal Grandmother   . Stroke Maternal Grandmother   . Cancer Maternal Grandfather     kidney, lung  . Lupus Paternal Grandmother   . Other Mother     had a cyst removed from ovary   Social History  Substance Use Topics  . Smoking status: Current Every Day Smoker -- 1.00 packs/day for 10 years    Types: Cigarettes  . Smokeless tobacco: Never Used  . Alcohol Use: Yes     Comment: rarely   OB History    Gravida Para Term Preterm AB TAB SAB Ectopic Multiple Living   Review of Systems  Constitutional: Negative for fever, activity change, appetite change and  unexpected weight change.  HENT: Negative for congestion.   Respiratory: Negative for cough, chest tightness and shortness of breath.   Cardiovascular: Negative for chest pain.  Gastrointestinal: Negative for nausea, vomiting and abdominal pain.  Genitourinary: Negative for dysuria and hematuria.  Musculoskeletal: Positive for myalgias and arthralgias.  Skin: Negative for wound.  Neurological: Negative for dizziness, weakness and headaches.  A complete 10 system review of systems was obtained and all systems are negative except as noted in the HPI and PMH.      Allergies  Review of patient's allergies indicates no known allergies.  Home Medications   Prior to Admission medications   Medication Sig Start Date End Date Taking? Authorizing Provider  ibuprofen (ADVIL,MOTRIN) 800 MG tablet Take 1 tablet (800 mg total) by mouth 3 (three) times daily. 09/23/15   Glynn Octave, MD  norethindrone (MICRONOR,CAMILA,ERRIN) 0.35 MG tablet Take 1 tablet (0.35 mg total) by mouth daily. 03/08/15   Adline Potter, NP   BP 143/78 mmHg  Pulse 96  Temp(Src) 98.4 F (36.9 C) (Oral)  Resp 20  Ht  (1.702 m)  Wt 143 lb (64.864 kg)  BMI 22.39 kg/m2  SpO2 99%  LMP 09/17/2015 Physical Exam  Constitutional: She is oriented to person, place, and time. She appears well-developed and well-nourished. No distress.  HENT:  Head: Normocephalic and atraumatic.  Mouth/Throat: Oropharynx is clear and moist. No oropharyngeal exudate.  Eyes: Conjunctivae and EOM are normal. Pupils are equal, round, and reactive to light.  Neck: Normal range of motion. Neck supple.  No meningismus.  Cardiovascular: Normal rate, regular rhythm, normal heart sounds and intact distal pulses.   No murmur heard. Pulmonary/Chest: Effort normal and breath sounds normal. No respiratory distress.  Abdominal: Soft. There is no tenderness. There is no rebound and no guarding.  Musculoskeletal: Normal range of motion. She exhibits  edema and tenderness.  Swelling and tenderness to left lateral malleolus. Intact DP and PT pulses. Achilles intact. No proximal fibula tenderness.  Neurological: She is alert and oriented to person, place, and time. No cranial nerve deficit. She exhibits normal muscle tone. Coordination normal.  No ataxia on finger to nose bilaterally. No pronator drift. 5/5 strength throughout. CN 2-12 intact.Equal grip strength. Sensation intact.   Skin: Skin is warm.  Psychiatric: She has a normal mood and affect. Her behavior is normal.  Nursing note and vitals reviewed.   ED Course  Procedures (including critical care time) Labs Review Labs Reviewed - No data to display  Imaging Review Dg Ankle Complete Left  09/23/2015  CLINICAL DATA:  Initial valuation for acute left ankle pain, swelling. EXAM: LEFT ANKLE COMPLETE - 3+ VIEW COMPARISON:  None. FINDINGS: No acute fracture or dislocation. Mild diffuse soft tissue swelling about the ankle. Joint effusion present. Osseous mineralization normal. IMPRESSION: 1. No acute fracture or dislocation. 2. Joint effusion with mild diffuse soft tissue swelling about the ankle. Electronically Signed   By: Rise Mu M.D.   On: 09/23/2015 20:40   I have personally reviewed and evaluated these images and lab results as part of my medical decision-making.   EKG Interpretation None      MDM   Final diagnoses:  Ankle sprain, left, initial encounter   ankle pain and swelling after twisting injury. Neurovascularly intact. X-rays negative for fracture.  ASO, crutches, RICE, elevation, follow-up with orthopedics. Patient is on duty tonight but has already been taken off of work. She wants to drive home. She'll not be given anything strong for pain other than ibuprofen. Follow up with orthopedics. Return precautions discussed.     Glynn Octave, MD 09/23/15 2103

## 2015-09-23 NOTE — Discharge Instructions (Signed)
Ankle Sprain  An ankle sprain is an injury to the strong, fibrous tissues (ligaments) that hold the bones of your ankle joint together.   CAUSES  An ankle sprain is usually caused by a fall or by twisting your ankle. Ankle sprains most commonly occur when you step on the outer edge of your foot, and your ankle turns inward. People who participate in sports are more prone to these types of injuries.   SYMPTOMS    Pain in your ankle. The pain may be present at rest or only when you are trying to stand or walk.   Swelling.   Bruising. Bruising may develop immediately or within 1 to 2 days after your injury.   Difficulty standing or walking, particularly when turning corners or changing directions.  DIAGNOSIS   Your caregiver will ask you details about your injury and perform a physical exam of your ankle to determine if you have an ankle sprain. During the physical exam, your caregiver will press on and apply pressure to specific areas of your foot and ankle. Your caregiver will try to move your ankle in certain ways. An X-ray exam may be done to be sure a bone was not broken or a ligament did not separate from one of the bones in your ankle (avulsion fracture).   TREATMENT   Certain types of braces can help stabilize your ankle. Your caregiver can make a recommendation for this. Your caregiver may recommend the use of medicine for pain. If your sprain is severe, your caregiver may refer you to a surgeon who helps to restore function to parts of your skeletal system (orthopedist) or a physical therapist.  HOME CARE INSTRUCTIONS    Apply ice to your injury for 1-2 days or as directed by your caregiver. Applying ice helps to reduce inflammation and pain.    Put ice in a plastic bag.    Place a towel between your skin and the bag.    Leave the ice on for 15-20 minutes at a time, every 2 hours while you are awake.   Only take over-the-counter or prescription medicines for pain, discomfort, or fever as directed by  your caregiver.   Elevate your injured ankle above the level of your heart as much as possible for 2-3 days.   If your caregiver recommends crutches, use them as instructed. Gradually put weight on the affected ankle. Continue to use crutches or a cane until you can walk without feeling pain in your ankle.   If you have a plaster splint, wear the splint as directed by your caregiver. Do not rest it on anything harder than a pillow for the first 24 hours. Do not put weight on it. Do not get it wet. You may take it off to take a shower or bath.   You may have been given an elastic bandage to wear around your ankle to provide support. If the elastic bandage is too tight (you have numbness or tingling in your foot or your foot becomes cold and blue), adjust the bandage to make it comfortable.   If you have an air splint, you may blow more air into it or let air out to make it more comfortable. You may take your splint off at night and before taking a shower or bath. Wiggle your toes in the splint several times per day to decrease swelling.  SEEK MEDICAL CARE IF:    You have rapidly increasing bruising or swelling.   Your toes feel   extremely cold or you lose feeling in your foot.   Your pain is not relieved with medicine.  SEEK IMMEDIATE MEDICAL CARE IF:   Your toes are numb or blue.   You have severe pain that is increasing.  MAKE SURE YOU:    Understand these instructions.   Will watch your condition.   Will get help right away if you are not doing well or get worse.     This information is not intended to replace advice given to you by your health care provider. Make sure you discuss any questions you have with your health care provider.     Document Released: 08/18/2005 Document Revised: 09/08/2014 Document Reviewed: 08/30/2011  Elsevier Interactive Patient Education 2016 [employer]

## 2015-09-23 NOTE — ED Notes (Signed)
Pt states that she "rolled" her ankle while stepping down the step off the ambulance, cms intact distal

## 2015-09-23 NOTE — ED Notes (Signed)
Pt alert & oriented x4, stable gait. Patient given discharge instructions, paperwork & prescription(s). Patient verbalized understanding. Pt left department in wheelchair escorted by staff. Pt left w/ no further questions. 

## 2015-11-11 ENCOUNTER — Encounter (HOSPITAL_COMMUNITY): Payer: Self-pay | Admitting: Emergency Medicine

## 2015-11-11 ENCOUNTER — Emergency Department (HOSPITAL_COMMUNITY)
Admission: EM | Admit: 2015-11-11 | Discharge: 2015-11-11 | Disposition: A | Discharge status: Home / Self Care | Payer: BLUE CROSS/BLUE SHIELD | Attending: Emergency Medicine | Admitting: Emergency Medicine

## 2015-11-11 DIAGNOSIS — E785 Hyperlipidemia, unspecified: Secondary | ICD-10-CM | POA: Diagnosis not present

## 2015-11-11 DIAGNOSIS — F17210 Nicotine dependence, cigarettes, uncomplicated: Secondary | ICD-10-CM | POA: Diagnosis not present

## 2015-11-11 DIAGNOSIS — R112 Nausea with vomiting, unspecified: Secondary | ICD-10-CM | POA: Diagnosis not present

## 2015-11-11 DIAGNOSIS — G43909 Migraine, unspecified, not intractable, without status migrainosus: Secondary | ICD-10-CM | POA: Diagnosis not present

## 2015-11-11 DIAGNOSIS — G43009 Migraine without aura, not intractable, without status migrainosus: Secondary | ICD-10-CM

## 2015-11-11 DIAGNOSIS — Z791 Long term (current) use of non-steroidal anti-inflammatories (NSAID): Secondary | ICD-10-CM | POA: Insufficient documentation

## 2015-11-11 DIAGNOSIS — R569 Unspecified convulsions: Secondary | ICD-10-CM | POA: Insufficient documentation

## 2015-11-11 MED ORDER — KETOROLAC TROMETHAMINE 30 MG/ML IJ SOLN
30.0000 mg | Freq: Once | INTRAMUSCULAR | Status: AC
  Administered 2015-11-11: 30 mg via INTRAVENOUS
  Filled 2015-11-11: qty 1

## 2015-11-11 MED ORDER — PROCHLORPERAZINE EDISYLATE 5 MG/ML IJ SOLN
10.0000 mg | Freq: Once | INTRAMUSCULAR | Status: AC
  Administered 2015-11-11: 10 mg via INTRAVENOUS
  Filled 2015-11-11: qty 2

## 2015-11-11 MED ORDER — BUTALBITAL-ASA-CAFF-CODEINE 50-325-40-30 MG PO CAPS: 1.0000 | ORAL_CAPSULE | Freq: Four times a day (QID) | ORAL | Status: DC | PRN

## 2015-11-11 MED ORDER — ONDANSETRON HCL 4 MG PO TABS: 4.0000 mg | ORAL_TABLET | Freq: Four times a day (QID) | ORAL | Status: DC

## 2015-11-11 MED ORDER — DIPHENHYDRAMINE HCL 50 MG/ML IJ SOLN
25.0000 mg | Freq: Once | INTRAMUSCULAR | Status: AC
  Administered 2015-11-11: 25 mg via INTRAVENOUS
  Filled 2015-11-11: qty 1

## 2015-11-11 NOTE — ED Notes (Signed)
PA at bedside.

## 2015-11-11 NOTE — ED Provider Notes (Signed)
CSN: [id number]     Arrival date & time 11/11/15  0803 History   First MD Initiated Contact with Patient 11/11/15 [phone]     Chief Complaint  Patient presents with  . Migraine     (Consider location/radiation/quality/duration/timing/severity/associated sxs/prior Treatment) HPI Comments: Patient is a 30 year old female who presents to the emergency department with complaint of "migraine headache".  The patient states that this problem started on March 11. She was generally not feeling well on March 10, but the symptoms of nausea, vomiting, headache, and sensitivity to light began on March 11. The patient states she tried over-the-counter medications. Even took a Imitrex tablet, but this would not help. She attempted to go to work today, but could not take the pain and discomfort. She notices and are, with black dots and floaters in her visual field at times. She states that she has had this in the past. She states she's also noticed a tremor. She is concerned because she sometimes has seizures related to her headaches if they are not resolved in a short order. The patient has not had any injury to the head or neck area. She's not had any significant changes in diet or medications.  The history is provided by the patient.    Past Medical History  Diagnosis Date  . UTI (lower urinary tract infection)   . Pelvic congestion syndrome   . Abnormal Pap smear of cervix   . Irregular bleeding 03/08/2015  . LLQ pain 03/08/2015  . Contraceptive management 03/08/2015  . Migraine with aura   . Hyperlipidemia   . Missed periods 07/19/2015   Past Surgical History  Procedure Laterality Date  . Colposcopy  04/2011   Family History  Problem Relation Age of Onset  . Asthma Son   . Depression Maternal Grandmother   . Heart disease Maternal Grandmother   . Stroke Maternal Grandmother   . Cancer Maternal Grandfather     kidney, lung  . Lupus Paternal Grandmother   . Other Mother     had a cyst removed  from ovary   Social History  Substance Use Topics  . Smoking status: Current Every Day Smoker -- 1.00 packs/day for 10 years    Types: Cigarettes  . Smokeless tobacco: Never Used  . Alcohol Use: Yes     Comment: rarely   OB History    Gravida Para Term Preterm AB TAB SAB Ectopic Multiple Living   Review of Systems  Eyes: Positive for photophobia.  Gastrointestinal: Positive for nausea and vomiting.  Neurological: Positive for seizures and headaches.  All other systems reviewed and are negative.     Allergies  Review of patient's allergies indicates no known allergies.  Home Medications   Prior to Admission medications   Medication Sig Start Date End Date Taking? Authorizing Provider  ibuprofen (ADVIL,MOTRIN) 800 MG tablet Take 1 tablet (800 mg total) by mouth 3 (three) times daily. 09/23/15   Glynn Octave, MD  norethindrone (MICRONOR,CAMILA,ERRIN) 0.35 MG tablet Take 1 tablet (0.35 mg total) by mouth daily. 03/08/15   Adline Potter, NP   There were no vitals taken for this visit. Physical Exam  Constitutional: She is oriented to person, place, and time. She appears well-developed and well-nourished.  Non-toxic appearance.  HENT:  Head: Normocephalic.  Right Ear: Tympanic membrane and external ear normal.  Left Ear: Tympanic membrane and external ear normal.  Eyes: EOM and lids  are normal. Pupils are equal, round, and reactive to light.  Neck: Normal range of motion. Neck supple. Carotid bruit is not present.  Cardiovascular: Normal rate, regular rhythm, normal heart sounds, intact distal pulses and normal pulses.   Pulmonary/Chest: Breath sounds normal. No respiratory distress.  Abdominal: Soft. Bowel sounds are normal. There is no tenderness. There is no guarding.  Musculoskeletal: Normal range of motion.  Lymphadenopathy:       Head (right side): No submandibular adenopathy present.       Head (left side): No submandibular adenopathy present.     She has no cervical adenopathy.  Neurological: She is alert and oriented to person, place, and time. She has normal strength. She displays tremor. No cranial nerve deficit or sensory deficit. She exhibits normal muscle tone. Coordination normal. GCS eye subscore is 4. GCS verbal subscore is 5. GCS motor subscore is 6.  Gait is steady. Speech is clear and coherent.  Skin: Skin is warm and dry.  Psychiatric: She has a normal mood and affect. Her speech is normal.  Nursing note and vitals reviewed.   ED Course  Procedures (including critical care time) Labs Review Labs Reviewed - No data to display  Imaging Review No results found. I have personally reviewed and evaluated these images and lab results as part of my medical decision-making.   EKG Interpretation None      MDM   9:26 - Headache slightly improved, but not resolved after medications.  10:28 AM-headache significantly improved per patient. Reexamination reveals no new neurologic changes. Patient feels she can manage the pain at home with medication. The patient is encouraged to see her primary physician to discuss breakthrough headache pain and a plan for her headache  . Patient is in agreement with this discharge plan.    Final diagnoses:  None    **I have reviewed nursing notes, vital signs, and all appropriate lab and imaging results for this patient.Ivery Quale*    Evanthia Maund, PA-C 11/11/15 1033  Marily MemosJason Mesner, MD 11/11/15 1515

## 2015-11-11 NOTE — ED Notes (Signed)
Patient c/o migraine headache, in which she has a hx. Patient states migraine started yesterday but has progressively gotten worse. Per patient took Imitrex with no relief. Patient reports some nausea, vomiting, and sensitivity to light and sound. Per patient has hx of seizures with migraines and started having tremors this morning.

## 2015-11-11 NOTE — Discharge Instructions (Signed)
Please discuss your breakthrough pain with Dr.Claggett. Use Zofran for nausea/vomiting. Use Tylenol, or ibuprofen for mild pain, use Fiorinal codeine for more severe pain. This medication may cause drowsiness, please use with caution. Migraine Headache A migraine headache is an intense, throbbing pain on one or both sides of your head. A migraine can last for 30 minutes to several hours. CAUSES  The exact cause of a migraine headache is not always known. However, a migraine may be caused when nerves in the brain become irritated and release chemicals that cause inflammation. This causes pain. Certain things may also trigger migraines, such as:  Alcohol.  Smoking.  Stress.  Menstruation.  Aged cheeses.  Foods or drinks that contain nitrates, glutamate, aspartame, or tyramine.  Lack of sleep.  Chocolate.  Caffeine.  Hunger.  Physical exertion.  Fatigue.  Medicines used to treat chest pain (nitroglycerine), birth control pills, estrogen, and some blood pressure medicines. SIGNS AND SYMPTOMS  Pain on one or both sides of your head.  Pulsating or throbbing pain.  Severe pain that prevents daily activities.  Pain that is aggravated by any physical activity.  Nausea, vomiting, or both.  Dizziness.  Pain with exposure to bright lights, loud noises, or activity.  General sensitivity to bright lights, loud noises, or smells. Before you get a migraine, you may get warning signs that a migraine is coming (aura). An aura may include:  Seeing flashing lights.  Seeing bright spots, halos, or zigzag lines.  Having tunnel vision or blurred vision.  Having feelings of numbness or tingling.  Having trouble talking.  Having muscle weakness. DIAGNOSIS  A migraine headache is often diagnosed based on:  Symptoms.  Physical exam.  A CT scan or MRI of your head. These imaging tests cannot diagnose migraines, but they can help rule out other causes of  headaches. TREATMENT Medicines may be given for pain and nausea. Medicines can also be given to help prevent recurrent migraines.  HOME CARE INSTRUCTIONS  Only take over-the-counter or prescription medicines for pain or discomfort as directed by your health care provider. The use of long-term narcotics is not recommended.  Lie down in a dark, quiet room when you have a migraine.  Keep a journal to find out what may trigger your migraine headaches. For example, write down:  What you eat and drink.  How much sleep you get.  Any change to your diet or medicines.  Limit alcohol consumption.  Quit smoking if you smoke.  Get 7-9 hours of sleep, or as recommended by your health care provider.  Limit stress.  Keep lights dim if bright lights bother you and make your migraines worse. SEEK IMMEDIATE MEDICAL CARE IF:   Your migraine becomes severe.  You have a fever.  You have a stiff neck.  You have vision loss.  You have muscular weakness or loss of muscle control.  You start losing your balance or have trouble walking.  You feel faint or pass out.  You have severe symptoms that are different from your first symptoms. MAKE SURE YOU:   Understand these instructions.  Will watch your condition.  Will get help right away if you are not doing well or get worse.   This information is not intended to replace advice given to you by your health care provider. Make sure you discuss any questions you have with your health care provider.   Document Released: 08/18/2005 Document Revised: 09/08/2014 Document Reviewed: 04/25/2013 Elsevier Interactive Patient Education [employer]2016 Elsevier Inc.

## 2015-11-20 ENCOUNTER — Encounter: Payer: Self-pay | Admitting: Advanced Practice Midwife

## 2015-11-20 ENCOUNTER — Ambulatory Visit (INDEPENDENT_AMBULATORY_CARE_PROVIDER_SITE_OTHER): Payer: BLUE CROSS/BLUE SHIELD | Admitting: Advanced Practice Midwife

## 2015-11-20 VITALS — BP 120/70 | Ht 68.0 in | Wt 152.0 lb

## 2015-11-20 DIAGNOSIS — N390 Urinary tract infection, site not specified: Secondary | ICD-10-CM | POA: Diagnosis not present

## 2015-11-20 DIAGNOSIS — R1030 Lower abdominal pain, unspecified: Secondary | ICD-10-CM

## 2015-11-20 LAB — POCT URINALYSIS DIPSTICK
GLUCOSE UA: NEGATIVE
Ketones, UA: NEGATIVE
Leukocytes, UA: NEGATIVE
NITRITE UA: POSITIVE
Protein, UA: NEGATIVE
RBC UA: NEGATIVE

## 2015-11-20 MED ORDER — SULFAMETHOXAZOLE-TRIMETHOPRIM 800-160 MG PO TABS: 1.0000 | ORAL_TABLET | Freq: Two times a day (BID) | ORAL | Status: DC

## 2015-11-20 NOTE — Progress Notes (Signed)
[facility] Visit  Patient name: Shirley Mendez MRN [id number]  Date of birth: [birth year]  CC & HPI:  Shirley Mendez is a 30 y.o. Caucasian female presenting today for C/O vaginal dc (brown) and lower abdominal pain (suprapubic) pain for 2 days. Pain is constant. No dysuria, frequency, or odor to urine.  Has had frequent UTIs, usually urine has an odor.  Is taking Micronior dt migraine HA, bleeds irregularly   Pertinent History Reviewed:  Medical & Surgical Hx:   Past Medical History  Diagnosis Date  . UTI (lower urinary tract infection)   . Pelvic congestion syndrome   . Abnormal Pap smear of cervix   . Irregular bleeding 03/08/2015  . LLQ pain 03/08/2015  . Contraceptive management 03/08/2015  . Migraine with aura   . Hyperlipidemia   . Missed periods 07/19/2015   Past Surgical History  Procedure Laterality Date  . Colposcopy  04/2011   Family History  Problem Relation Age of Onset  . Asthma Son   . Depression Maternal Grandmother   . Heart disease Maternal Grandmother   . Stroke Maternal Grandmother   . Cancer Maternal Grandfather     kidney, lung  . Lupus Paternal Grandmother   . Other Mother     had a cyst removed from ovary    Current outpatient prescriptions:  .  butalbital-aspirin-caffeine-codeine (FIORINAL/CODEINE #3) 50-325-40-30 MG capsule, Take 1 capsule by mouth every 6 (six) hours as needed for pain., Disp: 20 capsule, Rfl: 0 .  ibuprofen (ADVIL,MOTRIN) 200 MG tablet, Take 800-1,000 mg by mouth every 6 (six) hours as needed for headache or moderate pain., Disp: , Rfl:  .  norethindrone (MICRONOR,CAMILA,ERRIN) 0.35 MG tablet, Take 1 tablet (0.35 mg total) by mouth daily., Disp: 1 Package, Rfl: 11 .  ondansetron (ZOFRAN) 4 MG tablet, Take 1 tablet (4 mg total) by mouth every 6 (six) hours., Disp: 12 tablet, Rfl: 0 Social History: Reviewed -  reports that she has been smoking Cigarettes.  She has a 10 pack-year smoking history. She has never used smokeless  tobacco.  Review of Systems:   Constitutional: Negative for fever and chills Eyes: Negative for visual disturbances Respiratory: Negative for shortness of breath, dyspnea Cardiovascular: Negative for chest pain or palpitations  Gastrointestinal: Negative for vomiting, diarrhea and constipation; no abdominal pain Genitourinary: Negative for dysuria and urgency, vaginal irritation or itching Musculoskeletal: Negative for back pain, joint pain, myalgias  Neurological: Negative for dizziness and headaches    Objective Findings:    Physical Examination: General appearance - well appearing, and in no distress Mental status - alert, oriented to person, place, and time Chest:  Normal respiratory effort Heart - normal rate and regular rhythm Abdomen:  Soft, suprapubic tenderness Pelvic: small amount of brown dc.  Wet prep negative. Nontender uterus, neg CMT.  Musculoskeletal:  Normal range of motion without pain Extremities:  No edema    No results found for this or any previous visit (from the past 24 hour(s)).    Assessment & Plan:  A:   UTI P:   Meds ordered this encounter  Medications  . sulfamethoxazole-trimethoprim (BACTRIM DS,SEPTRA DS) 800-160 MG tablet    Sig: Take 1 tablet by mouth 2 (two) times daily.    Dispense:  10 tablet    Refill:  0    Order Specific Question:  Supervising Provider    Answer:  Duane LopeEURE, LUTHER H [[id number]]   Orders Placed This Encounter  Procedures  . Urine culture  .  POCT urinalysis dipstick      No Follow-up on file.  CRESENZO-DISHMAN,Linden Mikes CNM 11/20/2015 10:06 AM

## 2015-11-23 LAB — URINE CULTURE

## 2015-11-28 ENCOUNTER — Other Ambulatory Visit: Payer: Self-pay | Admitting: Advanced Practice Midwife

## 2015-11-28 MED ORDER — NITROFURANTOIN MONOHYD MACRO 100 MG PO CAPS: 100.0000 mg | ORAL_CAPSULE | Freq: Two times a day (BID) | ORAL | Status: AC

## 2015-11-28 NOTE — Progress Notes (Signed)
Spoke with pt letting her know her urine grew a bacteria often resistant to Septra. Macrobid was sent to pharmacy and pt should take med even if she's feeling better. Pt voiced understanding. JSY

## 2015-11-28 NOTE — Progress Notes (Signed)
Urine culture grew enterococcus faecalis, often resistant to Septra.  Macrobid prescribed

## 2015-12-06 ENCOUNTER — Encounter: Payer: Self-pay | Admitting: Adult Health

## 2015-12-06 ENCOUNTER — Other Ambulatory Visit (HOSPITAL_COMMUNITY)
Admission: RE | Admit: 2015-12-06 | Discharge: 2015-12-06 | Disposition: A | Discharge status: Home / Self Care | Payer: BLUE CROSS/BLUE SHIELD | Source: Ambulatory Visit | Attending: Adult Health | Admitting: Adult Health

## 2015-12-06 ENCOUNTER — Ambulatory Visit (INDEPENDENT_AMBULATORY_CARE_PROVIDER_SITE_OTHER): Payer: BLUE CROSS/BLUE SHIELD | Admitting: Adult Health

## 2015-12-06 VITALS — BP 124/62 | HR 94 | Ht 66.5 in | Wt 149.5 lb

## 2015-12-06 DIAGNOSIS — Z01411 Encounter for gynecological examination (general) (routine) with abnormal findings: Secondary | ICD-10-CM

## 2015-12-06 DIAGNOSIS — Z720 Tobacco use: Secondary | ICD-10-CM | POA: Diagnosis not present

## 2015-12-06 DIAGNOSIS — G43109 Migraine with aura, not intractable, without status migrainosus: Secondary | ICD-10-CM

## 2015-12-06 DIAGNOSIS — Z113 Encounter for screening for infections with a predominantly sexual mode of transmission: Secondary | ICD-10-CM | POA: Insufficient documentation

## 2015-12-06 DIAGNOSIS — F17200 Nicotine dependence, unspecified, uncomplicated: Secondary | ICD-10-CM

## 2015-12-06 DIAGNOSIS — Z01419 Encounter for gynecological examination (general) (routine) without abnormal findings: Secondary | ICD-10-CM | POA: Diagnosis present

## 2015-12-06 DIAGNOSIS — Z3041 Encounter for surveillance of contraceptive pills: Secondary | ICD-10-CM

## 2015-12-06 DIAGNOSIS — Z1151 Encounter for screening for human papillomavirus (HPV): Secondary | ICD-10-CM | POA: Insufficient documentation

## 2015-12-06 DIAGNOSIS — Z8619 Personal history of other infectious and parasitic diseases: Secondary | ICD-10-CM

## 2015-12-06 DIAGNOSIS — Z8742 Personal history of other diseases of the female genital tract: Secondary | ICD-10-CM

## 2015-12-06 HISTORY — DX: Personal history of other infectious and parasitic diseases: Z86.19

## 2015-12-06 MED ORDER — BUTALBITAL-ASA-CAFF-CODEINE 50-325-40-30 MG PO CAPS: 1.0000 | ORAL_CAPSULE | Freq: Four times a day (QID) | ORAL | Status: DC | PRN

## 2015-12-06 MED ORDER — NORETHINDRONE 0.35 MG PO TABS: 1.0000 | ORAL_TABLET | Freq: Every day | ORAL | Status: DC

## 2015-12-06 NOTE — Patient Instructions (Signed)
Physical in 1 year,pap in 3 if normal 

## 2015-12-06 NOTE — Progress Notes (Signed)
Patient ID: Shirley Mendez, female   DOB: [birth year], 30 y.o.   MRN: [id number] History of Present Illness: Shirley Mendez is a 30 year old white female, G2P2 in for a well woman gyn exam and pap. She has history of abnormal pap and +HPV.  Current Medications, Allergies, Past Medical History, Past Surgical History, Family History and Social History were reviewed in [facility] electronic medical record.     Review of Systems: Patient denies any daily headaches(has migraines with aura), hearing loss, fatigue, blurred vision, shortness of breath, chest pain, abdominal pain, problems with bowel movements, urination, or intercourse. No joint pain or mood swings.    Physical Exam:BP 124/62 mmHg  Pulse 94  Ht 5' 6.5" (1.689 m)  Wt 149 lb 8 oz (67.813 kg)  BMI 23.77 kg/m2  LMP 11/02/2015 General:  Well developed, well nourished, no acute distress Skin:  Warm and dry Neck:  Midline trachea, normal thyroid, good ROM, no lymphadenopathy Lungs; Clear to auscultation bilaterally Breast:  No dominant palpable mass, retraction, or nipple discharge,has accessory nipple left abdomen Cardiovascular: Regular rate and rhythm Abdomen:  Soft, non tender, no hepatosplenomegaly Pelvic:  External genitalia is normal in appearance, no lesions.  The vagina is normal in appearance. Urethra has no lesions or masses. The cervix is bulbous,pap with HPV and GC/CHL performed.  Uterus is felt to be normal size, shape, and contour.  No adnexal masses or tenderness noted.Bladder is non tender, no masses felt. Extremities/musculoskeletal:  No swelling or varicosities noted, no clubbing or cyanosis Psych:  No mood changes, alert and cooperative,seems happy   Impression: Well woman gyn exam and pap History of +HPV History of abnormal pap Contraceptive management Smoker Migraines with aura     Plan: Refilled micronor disp 1 pack take 1 daily with 11 refills Refilled fiorinal with codeine #20 take 1 every 6 hours prn  migraine no refills Physical in 1 year, pap in 3 if normal

## 2015-12-10 LAB — CYTOLOGY - PAP

## 2016-09-10 ENCOUNTER — Ambulatory Visit (INDEPENDENT_AMBULATORY_CARE_PROVIDER_SITE_OTHER): Payer: BLUE CROSS/BLUE SHIELD | Admitting: Adult Health

## 2016-09-10 ENCOUNTER — Encounter: Payer: Self-pay | Admitting: Adult Health

## 2016-09-10 VITALS — BP 122/80 | HR 104 | Ht 68.0 in | Wt 143.0 lb

## 2016-09-10 DIAGNOSIS — R252 Cramp and spasm: Secondary | ICD-10-CM | POA: Diagnosis not present

## 2016-09-10 DIAGNOSIS — N938 Other specified abnormal uterine and vaginal bleeding: Secondary | ICD-10-CM

## 2016-09-10 DIAGNOSIS — Z3202 Encounter for pregnancy test, result negative: Secondary | ICD-10-CM | POA: Diagnosis not present

## 2016-09-10 DIAGNOSIS — N926 Irregular menstruation, unspecified: Secondary | ICD-10-CM | POA: Insufficient documentation

## 2016-09-10 LAB — POCT URINE PREGNANCY: Preg Test, Ur: NEGATIVE

## 2016-09-10 NOTE — Progress Notes (Signed)
Subjective:     Patient ID: Shirley Mendez, female   DOB: 26-Jun-1986, 31 y.o.   MRN: [id number]  HPI Shirley Mendez is a 31 year old white female in for UPT, has missed a period and has had 5+HPT and spotted for 2 days 12/23 and then again 1/8, none now.Has had some cramps.  Review of Systems +missed period + irregular spotting Some cramps Reviewed past medical,surgical, social and family history. Reviewed medications and allergies.     Objective:   Physical Exam BP 122/80 (BP Location: Left Arm, Patient Position: Sitting, Cuff Size: Normal)   Pulse (!) 104   Ht 5\' 8"  (1.727 m)   Wt 143 lb (64.9 kg)   LMP 07/02/2016 (Approximate)   BMI 21.74 kg/m    UPT negative.PHQ 2 score 0.Skin warm and dry. Lungs: clear to ausculation bilaterally. Cardiovascular: regular rate and rhythm. [facility]   Assessment:     1. Missed period   2. Irregular bleeding   3. Pregnancy examination or test, negative result       Plan:    Cut back on smoking Check QHCG now Will talk in am

## 2016-09-11 ENCOUNTER — Telehealth: Payer: Self-pay | Admitting: Adult Health

## 2016-09-11 DIAGNOSIS — Z3490 Encounter for supervision of normal pregnancy, unspecified, unspecified trimester: Secondary | ICD-10-CM

## 2016-09-11 LAB — BETA HCG QUANT (REF LAB): hCG Quant: 69 m[IU]/mL

## 2016-09-11 NOTE — Telephone Encounter (Signed)
Mailbox full

## 2016-09-11 NOTE — Telephone Encounter (Signed)
Pt aware QHCG 69 and to recheck in am, she is spotting some

## 2016-09-13 LAB — BETA HCG QUANT (REF LAB): hCG Quant: 71 m[IU]/mL

## 2016-09-15 ENCOUNTER — Telehealth: Payer: Self-pay | Admitting: *Deleted

## 2016-09-15 DIAGNOSIS — O3680X0 Pregnancy with inconclusive fetal viability, not applicable or unspecified: Secondary | ICD-10-CM

## 2016-09-15 NOTE — Telephone Encounter (Signed)
Pt aware QHCG not doubling only ent up 2 will recheck this week, but not looking good

## 2016-09-17 LAB — BETA HCG QUANT (REF LAB): hCG Quant: 61 m[IU]/mL

## 2016-09-19 ENCOUNTER — Telehealth: Payer: Self-pay | Admitting: Women's Health

## 2016-09-19 NOTE — Telephone Encounter (Signed)
Pt states Shirley Mendez started her on new OCP last year and told her it may make her not have periods, she has been having regular periods until this month.  This month she has been having cramping  X 1 week but no bleeding.  Informed pt this can be normal and if is concerned can take HPT, if cramping continues or worsens can call back for OV.  Pt verbalized understanding.

## 2016-09-19 NOTE — Telephone Encounter (Signed)
Pt informed that HCG has dropped, it is now 61.  Pt states she is having a lot more cramping.  Informed her that she can expect to have the cramping and bleeding, we will let her know if Victorino DikeJennifer wants to recheck blood work one day next week to make sure getting back to 0.  Pt verbalized understanding.

## 2016-09-22 ENCOUNTER — Other Ambulatory Visit: Payer: BLUE CROSS/BLUE SHIELD

## 2016-09-22 DIAGNOSIS — Z3042 Encounter for surveillance of injectable contraceptive: Secondary | ICD-10-CM

## 2016-09-22 LAB — BETA HCG QUANT (REF LAB): hCG Quant: 45 m[IU]/mL

## 2016-09-23 ENCOUNTER — Encounter: Payer: Self-pay | Admitting: Adult Health

## 2016-09-23 ENCOUNTER — Telehealth: Payer: Self-pay | Admitting: Adult Health

## 2016-09-23 NOTE — Telephone Encounter (Signed)
Pt having pain in side, to call in am and get GYN US scheduled to assess
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
## 2016-09-24 ENCOUNTER — Other Ambulatory Visit: Payer: Self-pay | Admitting: Adult Health

## 2016-09-24 DIAGNOSIS — O3680X0 Pregnancy with inconclusive fetal viability, not applicable or unspecified: Secondary | ICD-10-CM

## 2016-09-26 ENCOUNTER — Ambulatory Visit (INDEPENDENT_AMBULATORY_CARE_PROVIDER_SITE_OTHER): Payer: BLUE CROSS/BLUE SHIELD

## 2016-09-26 DIAGNOSIS — N854 Malposition of uterus: Secondary | ICD-10-CM

## 2016-09-26 DIAGNOSIS — N83292 Other ovarian cyst, left side: Secondary | ICD-10-CM

## 2016-09-26 DIAGNOSIS — O3680X0 Pregnancy with inconclusive fetal viability, not applicable or unspecified: Secondary | ICD-10-CM

## 2016-09-26 NOTE — Progress Notes (Signed)
US NO IUP seen, homogenous retroverted uterus,normal right ovary,3.8 x 3 x 3 cm hemorrhagic left ovarian cyst,small amount of complex cul de sac and right adnexal fluid,Dr Emelda FearFerguson discussed results with pt.,labs will be done on Monday.

## 2016-09-29 ENCOUNTER — Encounter: Payer: Self-pay | Admitting: Adult Health

## 2016-09-29 ENCOUNTER — Telehealth: Payer: Self-pay | Admitting: Adult Health

## 2016-09-29 ENCOUNTER — Other Ambulatory Visit: Payer: BLUE CROSS/BLUE SHIELD

## 2016-09-29 DIAGNOSIS — O039 Complete or unspecified spontaneous abortion without complication: Secondary | ICD-10-CM

## 2016-09-29 DIAGNOSIS — N83202 Unspecified ovarian cyst, left side: Secondary | ICD-10-CM

## 2016-09-29 HISTORY — DX: Unspecified ovarian cyst, left side: N83.202

## 2016-09-29 LAB — BETA HCG QUANT (REF LAB): HCG QUANT: 20 m[IU]/mL

## 2016-09-29 NOTE — Telephone Encounter (Signed)
Pt aware of that QHCG drooped to 20 recheck in 1 week and that US showed hemorrhagic cyst left ovary, will recheck in 4 weeks for resolution.If increased pain, let us know. Dr Emelda FearFerguson spoke with pt Friday when she had US.

## 2016-09-30 ENCOUNTER — Encounter: Payer: Self-pay | Admitting: Adult Health

## 2016-09-30 ENCOUNTER — Telehealth: Payer: Self-pay | Admitting: Radiology

## 2016-09-30 NOTE — Telephone Encounter (Signed)
PT requested a CD of ultrasound pictures to transfer to Dr. Audie BoxFontaine. I informed her that her doctor could review image in EPIC and to call back if she had an additional problem.

## 2016-10-28 ENCOUNTER — Other Ambulatory Visit: Payer: BLUE CROSS/BLUE SHIELD

## 2016-10-28 ENCOUNTER — Ambulatory Visit (INDEPENDENT_AMBULATORY_CARE_PROVIDER_SITE_OTHER): Payer: BLUE CROSS/BLUE SHIELD

## 2016-10-28 ENCOUNTER — Other Ambulatory Visit: Payer: Self-pay | Admitting: Adult Health

## 2016-10-28 DIAGNOSIS — R102 Pelvic and perineal pain: Secondary | ICD-10-CM | POA: Diagnosis not present

## 2016-10-28 DIAGNOSIS — R1031 Right lower quadrant pain: Secondary | ICD-10-CM

## 2016-10-28 DIAGNOSIS — N83202 Unspecified ovarian cyst, left side: Secondary | ICD-10-CM | POA: Diagnosis not present

## 2016-10-28 DIAGNOSIS — N854 Malposition of uterus: Secondary | ICD-10-CM

## 2016-10-28 DIAGNOSIS — Z87440 Personal history of urinary (tract) infections: Secondary | ICD-10-CM

## 2016-10-28 NOTE — Progress Notes (Signed)
PELVIC US TA/TV: homogeneous retroverted uterus,wnl,EEC 8.8 mm,normal right ovary,resolving left hemorrhagic cyst 2.1 x 1.4 x 1.9 cm,small amount of simple left adnexal fluid,left adnexal pain during ultrasound,ovaries appear mobile

## 2016-10-29 ENCOUNTER — Telehealth: Payer: Self-pay | Admitting: Adult Health

## 2016-10-29 LAB — URINALYSIS, ROUTINE W REFLEX MICROSCOPIC
Bilirubin, UA: NEGATIVE
GLUCOSE, UA: NEGATIVE
KETONES UA: NEGATIVE
NITRITE UA: NEGATIVE
PROTEIN UA: NEGATIVE
RBC, UA: NEGATIVE
SPEC GRAV UA: 1.009 (ref 1.005–1.030)
Urobilinogen, Ur: 0.2 mg/dL (ref 0.2–1.0)
pH, UA: 7 (ref 5.0–7.5)

## 2016-10-29 LAB — MICROSCOPIC EXAMINATION: Casts: NONE SEEN /lpf

## 2016-10-29 NOTE — Telephone Encounter (Signed)
Pt aware that rt ovary normal and uterus was normal, and ovarian cyst on left is resolving is smaller than 4 weeks ago,(measures 2.1 x 1.4 x 1.9 cm now.)

## 2016-10-30 LAB — URINE CULTURE: Organism ID, Bacteria: NO GROWTH

## 2017-05-05 ENCOUNTER — Encounter: Payer: Self-pay | Admitting: Allergy & Immunology

## 2017-05-05 ENCOUNTER — Ambulatory Visit (INDEPENDENT_AMBULATORY_CARE_PROVIDER_SITE_OTHER): Payer: BLUE CROSS/BLUE SHIELD | Admitting: Allergy & Immunology

## 2017-05-05 VITALS — BP 118/78 | HR 94 | Temp 97.8°F | Resp 16 | Ht 66.14 in | Wt 136.2 lb

## 2017-05-05 DIAGNOSIS — R21 Rash and other nonspecific skin eruption: Secondary | ICD-10-CM

## 2017-05-05 NOTE — Patient Instructions (Addendum)
1. Rash - I have no idea what this rash it.  - I would like to get your Dermatology notes. - We will get some blood work: complete blood count, CRP, ESR, ANA, alpha-gal panel, serum tryptase - In the meantime, continue to take notes on triggers and keep taking pictures.  2. Return in about 6 months (around 11/02/2017).  Please inform us of any Emergency Department visits, hospitalizations, or changes in symptoms. Call us before going to the ED for breathing or allergy symptoms since we might be able to fit you in for a sick visit. Feel free to contact us anytime with any questions, problems, or concerns.  It was a pleasure to meet you today! Enjoy the upcoming fall!   Websites that have reliable patient information: 1. American Academy of Asthma, Allergy, and Immunology: www.aaaai.org 2. Food Allergy Research and Education (FARE): foodallergy.org 3. Mothers of Asthmatics: http://www.asthmacommunitynetwork.org 4. American College of Allergy, Asthma, and Immunology: www.acaai.org   Election Day is coming up on Tuesday, November 6th! Make your voice heard! Register to vote at vote.org!

## 2017-05-05 NOTE — Progress Notes (Addendum)
NEW PATIENT  Date of Service/Encounter:  05/05/17  Referring provider: Joyice Faster, FNP   Assessment:   Rash - ? drug reaction  Plan/Recommendations:   1. Rash - I honestly have no idea what this rash it, but considerations include Bechet's disease and other rheumatologic etiologies. - Biopsy seems consistent with a drug reaction, and I did consider a fixed drug eruption as an etiology. - However, the only medications she is on is her transdermal contraceptive which she has been on for quite some time.  - I did not see an indication for skin testing, but we can revisit this if the clinical picture changes.  - I would like to look over Ms. Esh's Dermatology notes, therefore we filled out a Release of Information form.  - We will get some blood work: complete blood count, CRP, ESR, ANA, alpha-gal panel, serum tryptase. - In the meantime, I asked Ms. Shinsky to continue to take notes on triggers and keep taking pictures. - I will discuss her case with my colleagues, and likely refer her to see Dermatology at [facility].   2. Return in about 6 months (around 11/02/2017).   Subjective:   Tyrika Newman is a 31 y.o. female presenting today for evaluation of  Chief Complaint  Patient presents with  . Allergic Reaction  . Angioedema    Dagmar Adcox has a history of the following: Patient Active Problem List   Diagnosis Date Noted  . Left ovarian cyst 09/29/2016  . Pregnancy examination or test, negative result 09/10/2016  . Missed period 09/10/2016  . 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 Missed periods 07/19/2015  . Irregular bleeding 03/08/2015  . LLQ pain 03/08/2015  . Contraceptive management 03/08/2015  . Migraines with aura 06/05/2014  . Pelvic congestion syndrome 06/05/2014  . History of abnormal cervical Pap smears 06/05/2014  . Smoker 06/05/2014    History obtained from: chart review and patient.  De Hollingshead was referred by Joyice Faster, FNP.      Tene is a 31 y.o. female presenting for evaluation of an allergic reaction and angioedema. Symptoms started around one year ago with "purple dots", but not urticaria. These do not resolve at all and are not painful. They do get bigger with each reaction. They are not pruritic at all. These flares are preceded by numbness and tingling in her vaginal mucosa and her mouth. These prodromal symptoms will last for days and then the new spots appear. These are not triggered by any particular partner or use of genital ointments/lubricants. She has been given prednisone, steroid creams, and antihistamines without resolution. Then it became slightly worse with increased numbers. She has been evaluated by Dermatologist and a biopsy showed "inflammation". Biopsy was done two months ago, and we do not have the results at this time. She has been so see only one dermatologist - Dr. Nevada Crane.    She has noted multiple triggers. She knows that Bactrim, NSAIDs, coffee, and vaping has worsened it. Her last reaction was 1.5 weeks ago. The last one was the most severe, with an enlargement of her tongue (August 21st - August 23rd). Her lip was so swollen that the lips actually tore. This has since healed and she has not pictures of this. The vape and Bactrim triggered these flares within 30-45 minutes, but the NSAIDs and coffee reactions look slightly longer. She will develop sores on the inside of her mouth as well as numbness. The sores inside her mucous membranes  do not persist. She denies fevers and joint pain. She has had a routine blood work performed, maybe a CBC, otherwise no workup at all. There is no family history of this. Symptoms occur any time throughout the year.  She did have a workup for lupus which was reportedly negative.   There is a dog at home, but this is a new addition. She tolerates all of the major food allergens without adverse event. She has no symptoms of allergic rhinitis or asthma.   There is an  interesting family history. Her son had a complicated pneumonia when he was 98 old.  He ended up losing three lobes of his lungs. He is followed by [facility] at their [facility]. Evidently he also had a "rare form" of TB when he was younger as well. He did undergo nine months of treatment. Despite this interesting history, he has grown well and has had no similar episodes since that time.   Otherwise, there is no history of other atopic diseases, including asthma, drug allergies, food allergies, environmental allergies, stinging insect allergies, or urticaria. There is no significant infectious history. Vaccinations are up to date.  Biopsy report (03/16/17):     Past Medical History: Patient Active Problem List   Diagnosis Date Noted  . Left ovarian cyst 09/29/2016  . Pregnancy examination or test, negative result 09/10/2016  . Missed period 09/10/2016  . 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 Missed periods 07/19/2015  . Irregular bleeding 03/08/2015  . LLQ pain 03/08/2015  . Contraceptive management 03/08/2015  . Migraines with aura 06/05/2014  . Pelvic congestion syndrome 06/05/2014  . History of abnormal cervical Pap smears 06/05/2014  . Smoker 06/05/2014    Medication List:  Allergies as of 05/05/2017      Reactions   Bactrim [sulfamethoxazole-trimethoprim] Swelling   Swelling & tingling   Nsaids    Purple rash      Medication List       Accurate as of 05/05/17 11:59 PM. Always use your most recent med list.          butalbital-aspirin-caffeine-codeine 50-325-40-30 MG capsule Commonly known as:  FIORINAL/CODEINE #3 Take 1 capsule by mouth every 6 (six) hours as needed for pain.   citalopram 20 MG tablet Commonly known as:  CELEXA Take 20 mg by mouth daily.   EPINEPHrine 0.3 mg/0.3 mL Soaj injection Commonly known as:  EPI-PEN USE FOR anaphylactic OR allergic reaction   levocetirizine 5 MG tablet Commonly known as:  XYZAL Take 5 mg by mouth  every evening.   nitrofurantoin (macrocrystal-monohydrate) 100 MG capsule Commonly known as:  Royetta Car 150-35 MCG/24HR transdermal patch Generic drug:  norelgestromin-ethinyl estradiol            Discharge Care Instructions        Start     Ordered   05/05/17 0000  CBC with Differential/Platelet     05/05/17 1423   05/05/17 0000  Sedimentation rate     05/05/17 1423   05/05/17 0000  Alpha-Gal Panel     05/05/17 1423   05/05/17 0000  Tryptase     05/05/17 1423   05/05/17 0000  ANA     05/05/17 1423      Birth History: non-contributory. Born at term without complications.   Developmental History: Briteny has met all milestones on time. She has required no speech therapy, occupational therapy, or physical therapy.  Past Surgical History: Past Surgical History:  Procedure Laterality Date  .  COLPOSCOPY  04/2011     Family History: Family History  Problem Relation Age of Onset  . Asthma Son   . Depression Maternal Grandmother   . Heart disease Maternal Grandmother   . Stroke Maternal Grandmother   . Cancer Maternal Grandfather        kidney, lung  . Lupus Paternal Grandmother   . Other Mother        had a cyst removed from ovary  . Eczema Maternal Uncle   . Allergic rhinitis Neg Hx   . Angioedema Neg Hx   . Immunodeficiency Neg Hx   . Urticaria Neg Hx      Social History: Annet lives at home with her son. She does vape occasionally at home. She works as an Public relations account executive. There is one dog at home.      Review of Systems: a 14-point review of systems is pertinent for what is mentioned in HPI.  Otherwise, all other systems were negative. Constitutional: negative other than that listed in the HPI Eyes: negative other than that listed in the HPI Ears, nose, mouth, throat, and face: negative other than that listed in the HPI Respiratory: negative other than that listed in the HPI Cardiovascular: negative other than that listed in the HPI Gastrointestinal:  negative other than that listed in the HPI Genitourinary: negative other than that listed in the HPI Integument: negative other than that listed in the HPI Hematologic: negative other than that listed in the HPI Musculoskeletal: negative other than that listed in the HPI Neurological: negative other than that listed in the HPI Allergy/Immunologic: negative other than that listed in the HPI    Objective:   Blood pressure 118/78, pulse 94, temperature 97.8 F (36.6 C), temperature source Oral, resp. rate 16, height 5' 6.14" (1.68 m), weight 136 lb 3.2 oz (61.8 kg), SpO2 98 %, unknown if currently breastfeeding. Body mass index is 21.89 kg/m.   Physical Exam:  General: Alert, interactive, in no acute distress. Pleasant.  Eyes: No conjunctival injection present on the right, No conjunctival injection present on the left, PERRL bilaterally, No discharge on the right, No discharge on the left and No Horner-Trantas dots present Ears: Right TM pearly gray with normal light reflex, Left TM pearly gray with normal light reflex, Right TM intact without perforation and Left TM intact without perforation.  Nose/Throat: External nose within normal limits and septum midline, turbinates minimally edematous without discharge, post-pharynx mildly erythematous without cobblestoning in the posterior oropharynx. Tonsils 2+ without exudates Neck: Supple without thyromegaly.  Adenopathy: shoddy bilateral anterior cervical lymphadenopathy. and no enlarged lymph nodes appreciated in the occipital, axillary, epitrochlear, inguinal, or popliteal regions. Lungs: Clear to auscultation without wheezing, rhonchi or rales. No increased work of breathing. CV: Normal S1/S2, no murmurs. Capillary refill <2 seconds.  Abdomen: Nondistended, nontender. No guarding or rebound tenderness. Bowel sounds present in all fields and hyperactive  Skin: Multiple somewhat eccymotic appearing isolated lesions over the entire body. Some  of the lesions have a somewhat wrinkled tissue paper apperaance. The lesion that was biopsied appears almost keloidal. Extremities:  No clubbing, cyanosis or edema. Neuro:   Grossly intact. No focal deficits appreciated. Responsive to questions.           Diagnostic studies: none     Salvatore Marvel, MD [place] [facility] [place]

## 2017-05-08 LAB — TRYPTASE: TRYPTASE: 3.8 ug/L (ref 2.2–13.2)

## 2017-05-08 LAB — ALPHA-GAL PANEL
Class Interpretation: 0
Class Interpretation: 0
LAMB CLASS INTERPRETATION: 0
Lamb/Mutton (Ovis spp) IgE: <0.1 kU/L (ref <0.35)
Pork (Sus spp) IgE: <0.1 kU/L (ref <0.35)

## 2017-05-08 LAB — CBC WITH DIFFERENTIAL/PLATELET
Basophils Absolute: 0 10*3/uL (ref 0.0–0.2)
Basos: 0 %
EOS (ABSOLUTE): 0.2 10*3/uL (ref 0.0–0.4)
EOS: 2 %
HEMATOCRIT: 41.1 % (ref 34.0–46.6)
HEMOGLOBIN: 13.8 g/dL (ref 11.1–15.9)
IMMATURE GRANULOCYTES: 0 %
Immature Grans (Abs): 0 10*3/uL (ref 0.0–0.1)
LYMPHS ABS: 3.6 10*3/uL — AB (ref 0.7–3.1)
Lymphs: 33 %
MCH: 31 pg (ref 26.6–33.0)
MCHC: 33.6 g/dL (ref 31.5–35.7)
MCV: 92 fL (ref 79–97)
MONOS ABS: 0.8 10*3/uL (ref 0.1–0.9)
Monocytes: 8 %
NEUTROS PCT: 57 %
Neutrophils Absolute: 6.1 10*3/uL (ref 1.4–7.0)
Platelets: 281 10*3/uL (ref 150–379)
RBC: 4.45 x10E6/uL (ref 3.77–5.28)
RDW: 13.3 % (ref 12.3–15.4)
WBC: 10.7 10*3/uL (ref 3.4–10.8)

## 2017-05-08 LAB — SEDIMENTATION RATE: SED RATE: 2 mm/h (ref 0–32)

## 2017-05-08 LAB — ANA: ANA Titer 1: NEGATIVE

## 2018-02-24 ENCOUNTER — Encounter: Payer: Self-pay | Admitting: Allergy & Immunology

## 2018-04-07 ENCOUNTER — Ambulatory Visit: Payer: BLUE CROSS/BLUE SHIELD | Admitting: Women's Health

## 2019-05-16 ENCOUNTER — Encounter (HOSPITAL_COMMUNITY): Payer: Self-pay

## 2019-05-16 ENCOUNTER — Emergency Department (HOSPITAL_COMMUNITY): Payer: BC Managed Care – PPO

## 2019-05-16 ENCOUNTER — Other Ambulatory Visit: Payer: Self-pay

## 2019-05-16 ENCOUNTER — Emergency Department (HOSPITAL_COMMUNITY)
Admission: EM | Admit: 2019-05-16 | Discharge: 2019-05-16 | Disposition: A | Discharge status: Home / Self Care | Payer: BC Managed Care – PPO | Attending: Emergency Medicine | Admitting: Emergency Medicine

## 2019-05-16 DIAGNOSIS — R102 Pelvic and perineal pain: Secondary | ICD-10-CM | POA: Insufficient documentation

## 2019-05-16 DIAGNOSIS — O039 Complete or unspecified spontaneous abortion without complication: Secondary | ICD-10-CM

## 2019-05-16 DIAGNOSIS — Z3A01 Less than 8 weeks gestation of pregnancy: Secondary | ICD-10-CM | POA: Insufficient documentation

## 2019-05-16 DIAGNOSIS — F17210 Nicotine dependence, cigarettes, uncomplicated: Secondary | ICD-10-CM | POA: Diagnosis not present

## 2019-05-16 DIAGNOSIS — O209 Hemorrhage in early pregnancy, unspecified: Secondary | ICD-10-CM

## 2019-05-16 DIAGNOSIS — O208 Other hemorrhage in early pregnancy: Secondary | ICD-10-CM | POA: Diagnosis present

## 2019-05-16 LAB — WET PREP, GENITAL
Sperm: NONE SEEN
Trich, Wet Prep: NONE SEEN
Yeast Wet Prep HPF POC: NONE SEEN

## 2019-05-16 LAB — CBC WITH DIFFERENTIAL/PLATELET
Abs Immature Granulocytes: 0.04 10*3/uL (ref 0.00–0.07)
Basophils Absolute: 0 10*3/uL (ref 0.0–0.1)
Basophils Relative: 0 %
Eosinophils Absolute: 0.1 10*3/uL (ref 0.0–0.5)
Eosinophils Relative: 1 %
HCT: 43 % (ref 36.0–46.0)
Hemoglobin: 14.6 g/dL (ref 12.0–15.0)
Immature Granulocytes: 0 %
Lymphocytes Relative: 21 %
Lymphs Abs: 2.3 10*3/uL (ref 0.7–4.0)
MCH: 31.4 pg (ref 26.0–34.0)
MCHC: 34 g/dL (ref 30.0–36.0)
MCV: 92.5 fL (ref 80.0–100.0)
Monocytes Absolute: 0.8 10*3/uL (ref 0.1–1.0)
Monocytes Relative: 7 %
Neutro Abs: 7.5 10*3/uL (ref 1.7–7.7)
Neutrophils Relative %: 71 %
Platelets: 254 10*3/uL (ref 150–400)
RBC: 4.65 MIL/uL (ref 3.87–5.11)
RDW: 11.7 % (ref 11.5–15.5)
WBC: 10.7 10*3/uL — ABNORMAL HIGH (ref 4.0–10.5)
nRBC: 0 % (ref 0.0–0.2)

## 2019-05-16 LAB — HCG, QUANTITATIVE, PREGNANCY: hCG, Beta Chain, Quant, S: 37 m[IU]/mL — ABNORMAL HIGH (ref <5)

## 2019-05-16 MED ORDER — RHO D IMMUNE GLOBULIN 1500 UNIT/2ML IJ SOSY
300.0000 ug | PREFILLED_SYRINGE | Freq: Once | INTRAMUSCULAR | Status: AC
  Administered 2019-05-16: 20:00:00 300 ug via INTRAMUSCULAR

## 2019-05-16 NOTE — ED Notes (Addendum)
Another 20 minutes before rho shot is ready, pt and partner made aware.

## 2019-05-16 NOTE — ED Provider Notes (Signed)
[facility] EMERGENCY DEPARTMENT Provider Note   CSN: [id number] Arrival date & time: 05/16/19  1415     History   Chief Complaint Chief Complaint  Patient presents with  . Vaginal Bleeding    HPI Shirley Mendez is a 33 y.o. female with a hx of hyperlipidemia, pelvic congestion syndrome, HPV, who is G6P2A3 that suspect she is about [redacted] weeks pregnant by her calculations who presents to the ED w/ complaints of vaginal bleeding that started yesterday. Initially was only having some light spotting, today had heavy bleeding with passage of clots shortly PTA w/ continued lesser bleeding now. Has had some associated pelvic cramping. No alleviating or aggravating factors. She states she is about 7 weeks, has not seen OB yet, plan for next week. Has had some nausea/vomiting over past week in the AMs but is able to tolerate PO. Denies fever, chills, vaginal discharge, diarrhea, or dysuria.      HPI  Past Medical History:  Diagnosis Date  . Abnormal Pap smear of cervix   . Contraceptive management 03/08/2015  . Eczema   . 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 Hyperlipidemia   . Irregular bleeding 03/08/2015  . Left ovarian cyst 09/29/2016   Recheck in 4 weeks   . LLQ pain 03/08/2015  . Migraine with aura   . Missed periods 07/19/2015  . Pelvic congestion syndrome   . UTI (lower urinary tract infection)     Patient Active Problem List   Diagnosis Date Noted  . Left ovarian cyst 09/29/2016  . Pregnancy examination or test, negative result 09/10/2016  . Missed period 09/10/2016  . 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 Missed periods 07/19/2015  . Irregular bleeding 03/08/2015  . LLQ pain 03/08/2015  . Contraceptive management 03/08/2015  . Migraines with aura 06/05/2014  . Pelvic congestion syndrome 06/05/2014  . History of abnormal cervical Pap smears 06/05/2014  . Smoker 06/05/2014    Past Surgical History:  Procedure Laterality Date  . COLPOSCOPY  04/2011     OB History    Gravida  5   Para  2   Term      Preterm      AB  1   Living  2     SAB  1   TAB      Ectopic      Multiple      Live Births               Home Medications    Prior to Admission medications   Medication Sig Start Date End Date Taking? Authorizing Provider  butalbital-aspirin-caffeine-codeine (FIORINAL/CODEINE #3) 50-325-40-30 MG capsule Take 1 capsule by mouth every 6 (six) hours as needed for pain. 12/06/15   Adline PotterGriffin, Jennifer A, NP  citalopram (CELEXA) 20 MG tablet Take 20 mg by mouth daily.    [provider]  EPINEPHrine 0.3 mg/0.3 mL IJ SOAJ injection USE FOR anaphylactic OR allergic reaction 04/02/17   [provider]  levocetirizine (XYZAL) 5 MG tablet Take 5 mg by mouth every evening.    [provider]  nitrofurantoin, macrocrystal-monohydrate, (MACROBID) 100 MG capsule  04/22/17   [provider]  Burr MedicoXULANE 150-35 MCG/24HR transdermal patch  05/01/17   [provider]    Family History Family History  Problem Relation Age of Onset  . Asthma Son   . Depression Maternal Grandmother   . Heart disease Maternal Grandmother   . Stroke Maternal Grandmother   . Cancer Maternal Grandfather  kidney, lung  . Lupus Paternal Grandmother   . Other Mother        had a cyst removed from ovary  . Eczema Maternal Uncle   . Allergic rhinitis Neg Hx   . Angioedema Neg Hx   . Immunodeficiency Neg Hx   . Urticaria Neg Hx     Social History Social History   Tobacco Use  . Smoking status: Current Every Day Smoker    Packs/day: 0.50    Years: 13.00    Pack years: 6.50    Types: Cigarettes  . Smokeless tobacco: Never Used  Substance Use Topics  . Alcohol use: No    Comment: rarely; not now  . Drug use: No     Allergies   Bactrim [sulfamethoxazole-trimethoprim] and Nsaids   Review of Systems Review of Systems  Constitutional: Negative for chills and fever.  Respiratory: Negative for shortness of breath.    Cardiovascular: Negative for chest pain.  Gastrointestinal: Positive for nausea and vomiting. Negative for constipation and diarrhea.  Genitourinary: Positive for pelvic pain and vaginal bleeding. Negative for dysuria and vaginal discharge.  Neurological: Negative for syncope.  All other systems reviewed and are negative.    Physical Exam Updated Vital Signs BP 135/86 (BP Location: Right Arm)   Pulse (!) 101   Temp 98 F (36.7 C) (Oral)   Resp 17   LMP 04/07/2019   SpO2 100%   Physical Exam Vitals signs and nursing note reviewed. Exam conducted with a chaperone present.  Constitutional:      General: She is not in acute distress.    Appearance: She is well-developed. She is not toxic-appearing.  HENT:     Head: Normocephalic and atraumatic.  Eyes:     General:        Right eye: No discharge.        Left eye: No discharge.     Conjunctiva/sclera: Conjunctivae normal.  Neck:     Musculoskeletal: Neck supple.  Cardiovascular:     Rate and Rhythm: Normal rate and regular rhythm.  Pulmonary:     Effort: Pulmonary effort is normal. No respiratory distress.     Breath sounds: Normal breath sounds. No wheezing, rhonchi or rales.  Abdominal:     General: There is no distension.     Palpations: Abdomen is soft.     Tenderness: There is no abdominal tenderness. There is no right CVA tenderness, left CVA tenderness, guarding or rebound.  Genitourinary:    Labia:        Right: No lesion.        Left: No lesion.      Vagina: Bleeding (mild amount of blood present in vaginal vault) present. No vaginal discharge.     Cervix: No cervical motion tenderness.     Adnexa:        Right: No mass, tenderness or fullness.         Left: No mass, tenderness or fullness.    Skin:    General: Skin is warm and dry.     Findings: No rash.  Neurological:     Mental Status: She is alert.     Comments: Clear speech.   Psychiatric:        Behavior: Behavior normal.      ED Treatments /  Results  Labs (all labs ordered are listed, but only abnormal results are displayed) Labs Reviewed  WET PREP, GENITAL - Abnormal; Notable for the following components:  Result Value   Clue Cells Wet Prep HPF POC PRESENT (*)    WBC, Wet Prep HPF POC RARE (*)    All other components within normal limits  CBC WITH DIFFERENTIAL/PLATELET - Abnormal; Notable for the following components:   WBC 10.7 (*)    All other components within normal limits  HCG, QUANTITATIVE, PREGNANCY - Abnormal; Notable for the following components:   hCG, Beta Chain, Quant, S 37 (*)    All other components within normal limits  RH IG WORKUP (INCLUDES ABO/RH)  GC/CHLAMYDIA PROBE AMP ([place]) NOT [facility]    EKG None  Radiology [place] Ob Less Than 14 Weeks With Ob Transvaginal  Result Date: 05/16/2019 CLINICAL DATA:  Vaginal bleeding for 1 day. Patient reportedly pregnant, estimated at 5 weeks and 4 days pregnant based on her last menstrual period. EXAM: OBSTETRIC <14 WK US AND TRANSVAGINAL OB US TECHNIQUE: Both transabdominal and transvaginal ultrasound examinations were performed for complete evaluation of the gestation as well as the maternal uterus, adnexal regions, and pelvic cul-de-sac. Transvaginal technique was performed to assess early pregnancy. COMPARISON:  None. FINDINGS: Intrauterine gestational sac: None Yolk sac:  Not Visualized. Embryo:  Not Visualized. Cardiac Activity: Not Visualized. Maternal uterus/adnexae: Uterus is retroverted, normal in size with no masses. Cervix unremarkable. Endometrium normal in thickness measuring 2 mm. No endometrial masses or fluid. Both ovaries are visualized and are normal in size and appearance. Normal blood flow on color Doppler analysis. No adnexal masses. No abnormal pelvic free fluid. IMPRESSION: 1. No evidence of an intrauterine or ectopic pregnancy. Findings may reflect a very early pregnancy, too early to visualize sonographically, and warrant correlation with  quantitative beta HCG level. 2. Normal appearance of the uterus, endometrium and ovaries/adnexa. Electronically Signed   By: Amie Portlandavid  Ormond M.D.   On: 05/16/2019 16:41    Procedures Procedures (including critical care time)  Medications Ordered in ED Medications  rho (d) immune globulin (RHIG/RHOPHYLAC) injection 300 mcg (has no administration in time range)     Initial Impression / Assessment and Plan / ED Course  I have reviewed the triage vital signs and the nursing notes.  Pertinent labs & imaging results that were available during my care of the patient were reviewed by me and considered in my medical decision making (see chart for details).   Patient presents to the ED suspected to be [redacted] weeks pregnant w/ vaginal bleeding & pelvic cramping. Nontoxic appearing, resting comfortably, vitals without significant abnormalities. Abdomen nontender w/o peritoneal signs. Speculum/bimanual w/ vaginal bleeding, no significant discharge or cervical motion/adnexal tenderness- not consistent w/ PID. US by triage team  1. No evidence of an intrauterine or ectopic pregnancy. Findings may reflect a very early pregnancy, too early to visualize sonographically, and warrant correlation with quantitative beta HCG level. 2. Normal appearance of the uterus, endometrium and ovaries/adnexa. ---> quant 37, given timeframe patient reports feel that early pregnancy is fairly unlikely, more likely miscarriage. Hgb/hct stable. Wet prep w/ clue cells- patient w/o discharge, do not feel this needs tx. Will give Rhogam as she is RH negative. OBGYN follow up. I discussed results, treatment plan, need for follow-up, and return precautions with the patient & her significant other @ bedside. Provided opportunity for questions, patient & her significant other confirmed understanding and are in agreement with plan.      Final Clinical Impressions(s) / ED Diagnoses   Final diagnoses:  Miscarriage    ED Discharge Orders     None  Leafy Kindle 05/16/19 1937    Fredia Sorrow, MD 05/23/19 [phone]

## 2019-05-16 NOTE — ED Triage Notes (Signed)
Pt presents to ED with complaints of vaginal bleeding. Pt states she is [redacted] weeks pregnant. Pt states she has had the same pad for last 3 hours and bleeding is bright red. Pt attempted to call OB but unable to get appointment.

## 2019-05-16 NOTE — Discharge Instructions (Signed)
You were seen in the ER today and suspected to be having a miscarriage. You were given rhogam in the ER.  Please call your obgyn for follow up within 3 days. Return to the ER for new or worsening symptoms including but not limited to heavy bleeding, abdominal pain, fever, chills, inability to keep fluids down or any other concerns.

## 2019-05-17 LAB — RH IG WORKUP (INCLUDES ABO/RH)
ABO/RH(D): A NEG
Antibody Screen: NEGATIVE
Gestational Age(Wks): 7
Unit division: 0

## 2019-05-18 LAB — GC/CHLAMYDIA PROBE AMP (~~LOC~~) NOT AT ARMC
Chlamydia: NEGATIVE
Neisseria Gonorrhea: NEGATIVE

## 2020-05-14 ENCOUNTER — Other Ambulatory Visit: Payer: Self-pay

## 2020-05-14 ENCOUNTER — Encounter (HOSPITAL_COMMUNITY): Payer: Self-pay | Admitting: *Deleted

## 2020-05-14 DIAGNOSIS — O26891 Other specified pregnancy related conditions, first trimester: Secondary | ICD-10-CM | POA: Insufficient documentation

## 2020-05-14 DIAGNOSIS — O219 Vomiting of pregnancy, unspecified: Secondary | ICD-10-CM | POA: Diagnosis not present

## 2020-05-14 DIAGNOSIS — O99611 Diseases of the digestive system complicating pregnancy, first trimester: Secondary | ICD-10-CM | POA: Diagnosis not present

## 2020-05-14 DIAGNOSIS — Z5321 Procedure and treatment not carried out due to patient leaving prior to being seen by health care provider: Secondary | ICD-10-CM | POA: Diagnosis not present

## 2020-05-14 DIAGNOSIS — Z3A01 Less than 8 weeks gestation of pregnancy: Secondary | ICD-10-CM | POA: Diagnosis not present

## 2020-05-14 NOTE — ED Triage Notes (Signed)
Pt states she tested positive for covid on 9/2 and she is supposed to go back to work tomorrow but she started having severe abdominal pain yesterday and little urine output; pt states she is [redacted] weeks pregnant and has been trying to stay hydrated but she continues to have n/v/d

## 2020-05-15 ENCOUNTER — Emergency Department (HOSPITAL_COMMUNITY)
Admission: EM | Admit: 2020-05-15 | Discharge: 2020-05-15 | Disposition: A | Discharge status: Home / Self Care | Payer: Managed Care, Other (non HMO) | Attending: Emergency Medicine | Admitting: Emergency Medicine

## 2020-05-15 LAB — URINALYSIS, ROUTINE W REFLEX MICROSCOPIC
Bilirubin Urine: NEGATIVE
Glucose, UA: NEGATIVE mg/dL
Hgb urine dipstick: NEGATIVE
Ketones, ur: NEGATIVE mg/dL
Leukocytes,Ua: NEGATIVE
Nitrite: NEGATIVE
Protein, ur: NEGATIVE mg/dL
Specific Gravity, Urine: 1.011 (ref 1.005–1.030)
pH: 7 (ref 5.0–8.0)

## 2020-07-16 IMAGING — US US OB < 14 WEEKS - US OB TV
1 series · 13 of 28 positions shown · non-contrast
Comparison: None.

CLINICAL DATA: Vaginal bleeding for 1 day. Patient reportedly
pregnant, estimated at 5 weeks and 4 days pregnant based on her last
menstrual period.

EXAM:
OBSTETRIC <14 WK US AND TRANSVAGINAL OB US
TECHNIQUE: Both transabdominal and transvaginal ultrasound examinations were
performed for complete evaluation of the gestation as well as the
maternal uterus, adnexal regions, and pelvic cul-de-sac.
Transvaginal technique was performed to assess early pregnancy.

[Series 1: us ob < 14 weeks - us ob tv · 0.19mm/px · 13 of 75 slices shown]
[im 3/75]
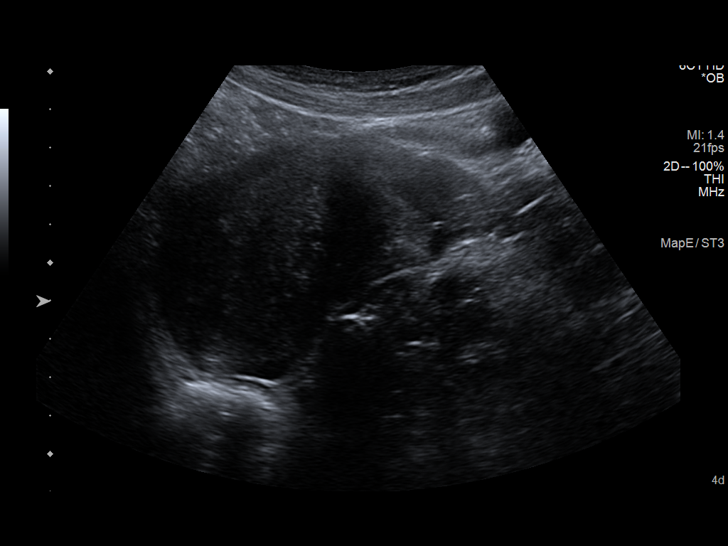
[im 9/75]
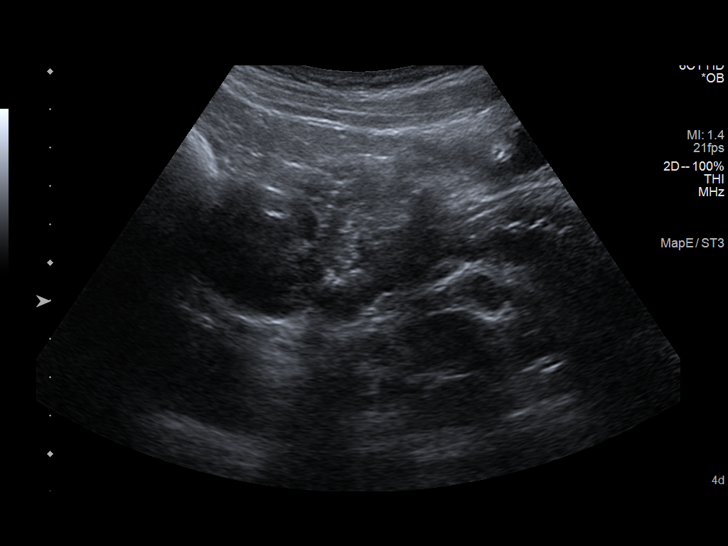
[im 14/75]
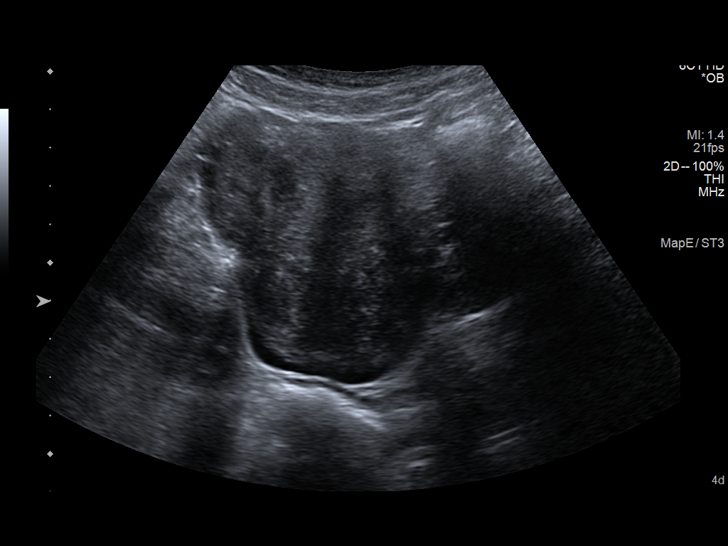
[im 20/75]
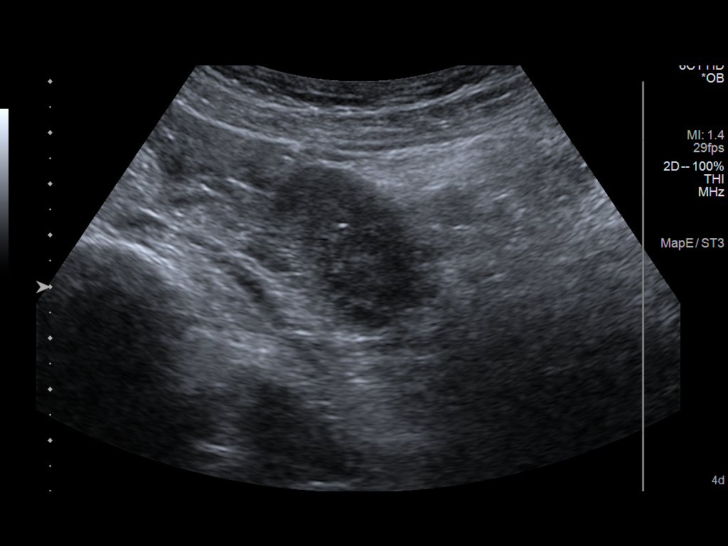
[im 25/75]
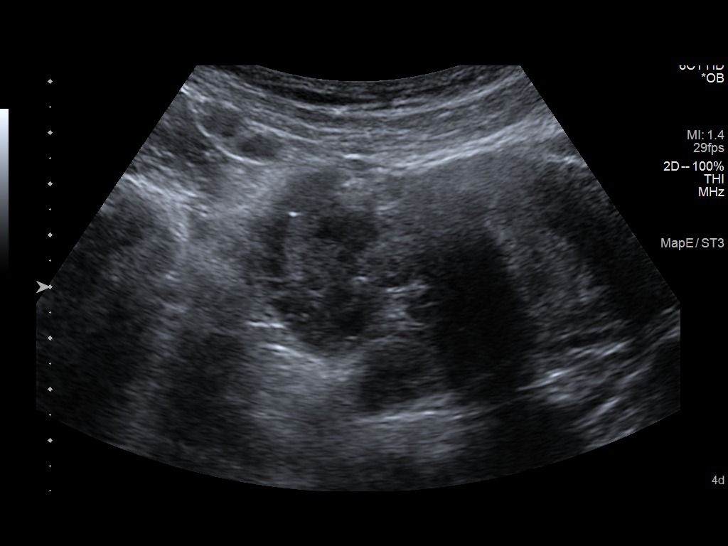
[im 31/75]
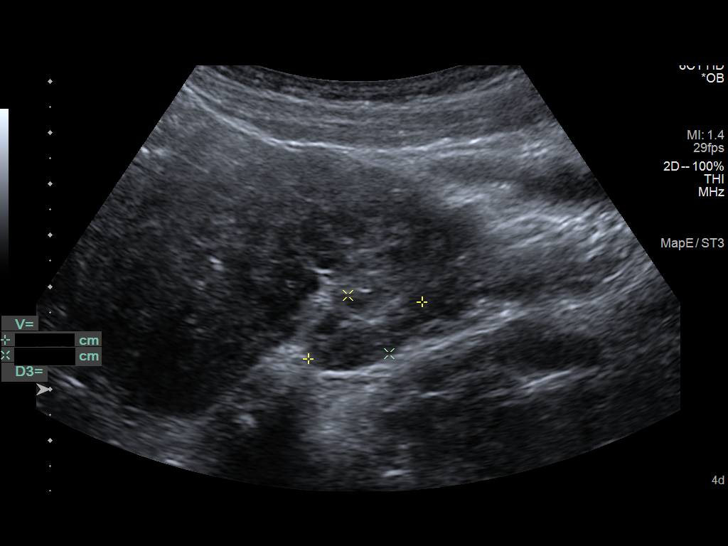
[im 39/75]
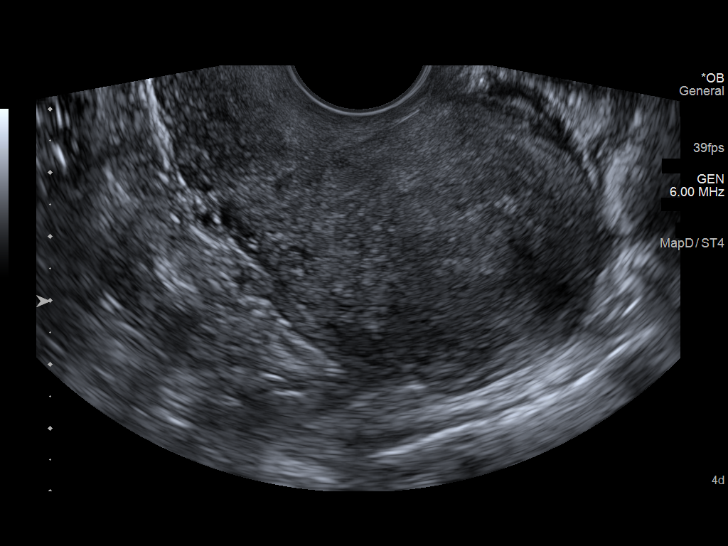
[im 44/75]
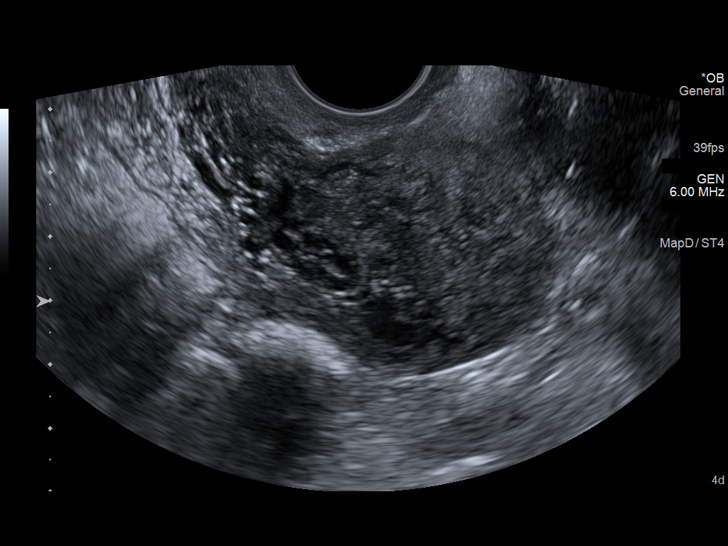
[im 50/75]
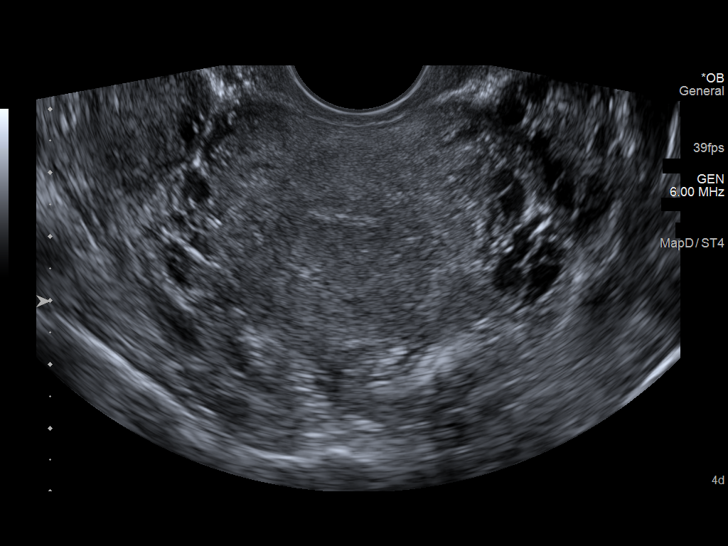
[im 55/75]
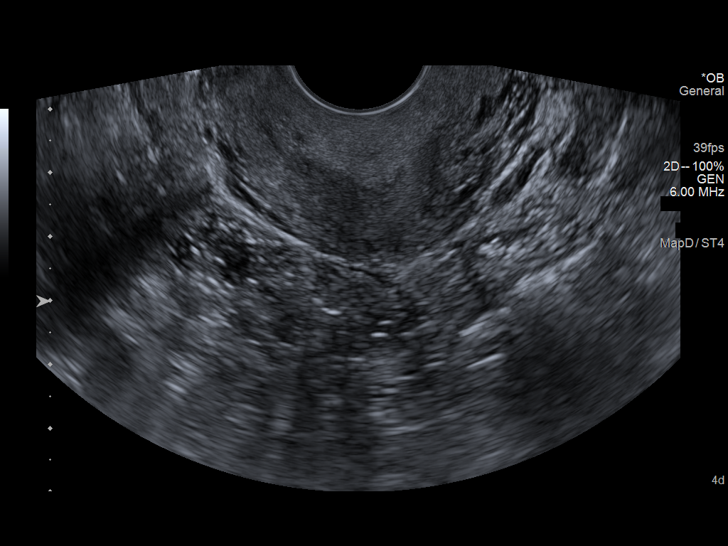
[im 61/75]
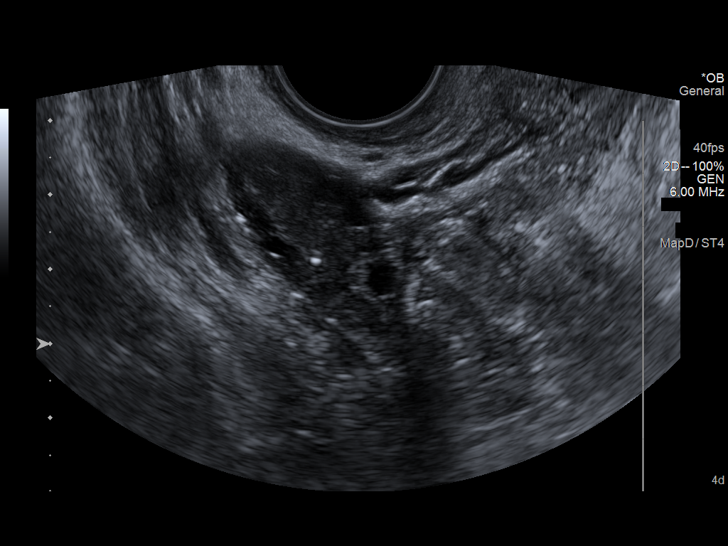
[im 66/75]
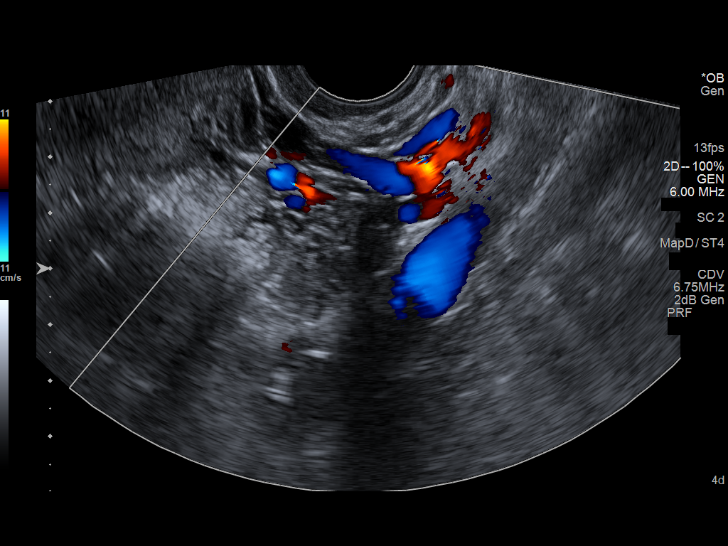
[im 72/75]
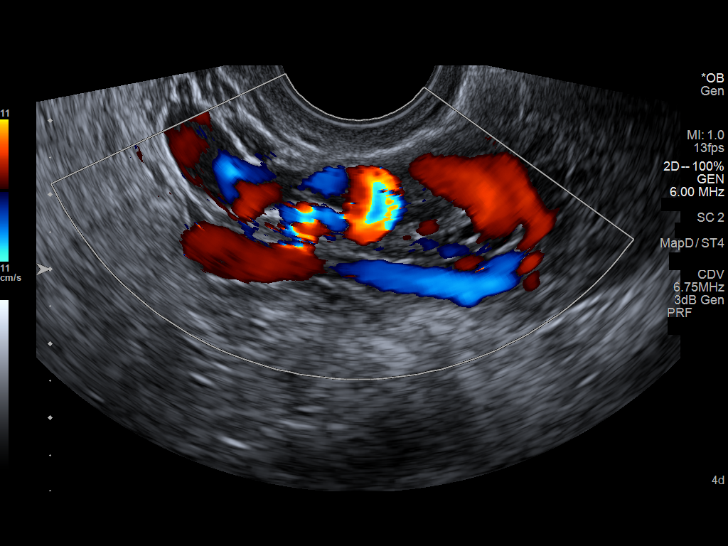

[13 of 28 positions shown; findings below may reference images not displayed]

FINDINGS: Intrauterine gestational sac: None

Yolk sac:  Not Visualized.

Embryo:  Not Visualized.

Cardiac Activity: Not Visualized.

Maternal uterus/adnexae: Uterus is retroverted, normal in size with
no masses. Cervix unremarkable. Endometrium normal in thickness
measuring 2 mm. No endometrial masses or fluid.

Both ovaries are visualized and are normal in size and appearance.
Normal blood flow on color Doppler analysis. No adnexal masses. No
abnormal pelvic free fluid.
IMPRESSION: 1. No evidence of an intrauterine or ectopic pregnancy. Findings may
reflect a very early pregnancy, too early to visualize
sonographically, and warrant correlation with quantitative beta HCG
level.
2. Normal appearance of the uterus, endometrium and ovaries/adnexa.

## 2022-06-27 ENCOUNTER — Other Ambulatory Visit (HOSPITAL_COMMUNITY): Payer: Self-pay | Admitting: Surgical

## 2022-06-27 MED ORDER — PREDNISONE 10 MG PO TABS: ORAL_TABLET | ORAL | 0 refills | Status: DC
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
## 2022-09-10 ENCOUNTER — Ambulatory Visit: Payer: Managed Care, Other (non HMO) | Admitting: Psychiatry

## 2022-09-10 ENCOUNTER — Encounter: Payer: Self-pay | Admitting: Psychiatry

## 2022-09-10 ENCOUNTER — Telehealth: Payer: Self-pay | Admitting: Psychiatry

## 2022-09-10 VITALS — BP 142/90 | HR 77 | Ht 69.0 in | Wt 172.0 lb

## 2022-09-10 DIAGNOSIS — G44309 Post-traumatic headache, unspecified, not intractable: Secondary | ICD-10-CM | POA: Diagnosis not present

## 2022-09-10 DIAGNOSIS — G43119 Migraine with aura, intractable, without status migrainosus: Secondary | ICD-10-CM | POA: Diagnosis not present

## 2022-09-10 MED ORDER — PROPRANOLOL HCL 20 MG PO TABS: 20.0000 mg | ORAL_TABLET | Freq: Two times a day (BID) | ORAL | 6 refills | Status: DC

## 2022-09-10 NOTE — Telephone Encounter (Signed)
[employer] sent to GI they obtain auth [id number] 

## 2022-09-10 NOTE — Patient Instructions (Addendum)
Plan:  Start propranolol 20 mg twice a day for migraine prevention  Decrease rizatriptan to 5 mg as needed (can take 1/2 of a 10 mg tablet)  CT of the brain. You will receive a call to schedule this once it is approved by your insurance

## 2022-10-09 ENCOUNTER — Ambulatory Visit
Admission: RE | Admit: 2022-10-09 | Discharge: 2022-10-09 | Disposition: A | Discharge status: Home / Self Care | Payer: Managed Care, Other (non HMO) | Source: Ambulatory Visit | Attending: Psychiatry | Admitting: Psychiatry

## 2022-10-09 DIAGNOSIS — G44309 Post-traumatic headache, unspecified, not intractable: Secondary | ICD-10-CM | POA: Diagnosis not present

## 2023-04-14 NOTE — Progress Notes (Unsigned)
   CC:  headaches  Follow-up Visit  Last visit: 09/10/22  Brief HPI: 37 year old female with a history of psoriasis, anxiety, OCD, hypothyroidism, hyperlipidemia, lumbar radiculopathy who follows in clinic for migraines. [facility] 10/09/22 was unremarkable.  At her last visit she was started on propranolol for migraine prevention. Maxalt was continued for rescue. Interval History: ***   Headache days per month: *** Migraine days per month*** Headache free days per month: ***  Current Headache Regimen: Preventative: *** Abortive: ***   Prior Therapies                                  Rescue: Ibuprofen Tylenol Maxalt 10 mg PRN Zofran 4 mg PRN Flexeril 10 mg TID PRN Soma 350 mg PRN   Prevention: Fluvoxamine 25 mg BID - lack of efficacy Topamax - lack of efficacy  Physical Exam:   Vital Signs: There were no vitals taken for this visit. GENERAL:  well appearing, in no acute distress, alert  SKIN:  Color, texture, turgor normal. No rashes or lesions HEAD:  Normocephalic/atraumatic. RESP: normal respiratory effort MSK:  No gross joint deformities.   NEUROLOGICAL: Mental Status: Alert, oriented to person, place and time, Follows commands, and Speech fluent and appropriate. Cranial Nerves: PERRL, face symmetric, no dysarthria, hearing grossly intact Motor: moves all extremities equally Gait: normal-based.  IMPRESSION: ***  PLAN: ***   Follow-up: ***  I spent a total of *** minutes on the date of the service. Headache education was done. Discussed lifestyle modification including increased oral hydration, decreased caffeine, exercise and stress management. Discussed treatment options including preventive and acute medications, natural supplements, and infusion therapy. Discussed medication overuse headache and to limit use of acute treatments to no more than 2 days/week or 10 days/month. Discussed medication side effects, adverse reactions and drug interactions. Written  educational materials and patient instructions outlining all of the above were given.  Ocie Doyne, MD

## 2023-04-15 ENCOUNTER — Encounter: Payer: Self-pay | Admitting: Psychiatry

## 2023-04-15 ENCOUNTER — Ambulatory Visit (INDEPENDENT_AMBULATORY_CARE_PROVIDER_SITE_OTHER): Payer: Managed Care, Other (non HMO) | Admitting: Psychiatry

## 2023-04-15 ENCOUNTER — Other Ambulatory Visit: Payer: Self-pay | Admitting: Psychiatry

## 2023-04-15 VITALS — BP 120/74 | HR 59 | Wt 176.4 lb

## 2023-04-15 DIAGNOSIS — G43119 Migraine with aura, intractable, without status migrainosus: Secondary | ICD-10-CM | POA: Diagnosis not present

## 2023-04-15 MED ORDER — EMGALITY 120 MG/ML ~~LOC~~ SOAJ: 240.0000 mg | Freq: Once | SUBCUTANEOUS | 0 refills | Status: AC

## 2023-04-15 MED ORDER — RIZATRIPTAN BENZOATE 10 MG PO TABS: 5.0000 mg | ORAL_TABLET | ORAL | 6 refills | Status: DC | PRN

## 2023-04-15 MED ORDER — PROPRANOLOL HCL 20 MG PO TABS: 20.0000 mg | ORAL_TABLET | Freq: Two times a day (BID) | ORAL | 6 refills | Status: DC

## 2023-04-15 MED ORDER — EMGALITY 120 MG/ML ~~LOC~~ SOAJ: 1.0000 | SUBCUTANEOUS | 6 refills | Status: DC

## 2023-04-15 NOTE — Patient Instructions (Signed)
Start Emgality for migraine prevention. If headaches are well-controlled after 3-4 weeks, decrease propranolol to 20 mg daily x1 week. If headaches remain well-controlled after 1 week, stop propranolol.

## 2023-04-16 ENCOUNTER — Telehealth: Payer: Self-pay | Admitting: Neurology

## 2023-04-16 NOTE — Telephone Encounter (Signed)
Pharmacy flagged propranolol prescription for a possible interaction with Maxalt.  I am not aware of such interaction, Dr. Delena Bali please review migraine regimen for any possible interactions or concerns.

## 2023-04-20 NOTE — Telephone Encounter (Signed)
Propranolol increases the concentration of Maxalt, which is why her dose is ordered as 5 mg PRN. She can take Maxalt and propranolol as long as she is taking only 5 mg of Maxalt per dose

## 2023-05-07 ENCOUNTER — Telehealth: Payer: Self-pay

## 2023-05-07 ENCOUNTER — Other Ambulatory Visit (HOSPITAL_COMMUNITY): Payer: Self-pay

## 2023-05-07 NOTE — Telephone Encounter (Signed)
*  [facility]  Pharmacy Patient Advocate Encounter   Received notification from CoverMyMeds that prior authorization for Emgality 120MG /ML auto-injectors (migraine)  is required/requested.   Insurance verification completed.   The patient is insured through [employer] .   Per test claim: PA required; PA submitted to [employer] via CoverMyMeds Key/confirmation #/EOC [id number] Status is pending

## 2023-06-03 NOTE — Telephone Encounter (Signed)
Pharmacy Patient Advocate Encounter  Received notification from [employer] that Prior Authorization for Emgality has been DENIED.  See denial reason below. No denial letter attached in CMM. Will attache denial letter to Media tab once received.   PA #/Case ID/Reference #: There is no indication that your patient has/had greater than or equal to 4 migraine headache days  per month (prior to initiating a migraine-preventive medication).Emgality is considered medically necessary when the following criteria are met: FDA-Approved  Indications - 1. Episodic Cluster Headache Treatment. Approve for 6 months if the patient meets all of the following (A, B, C, D, and E): A) Patient is greater than or equal to 74 years of age; and B) Patient has between one headache every other day and eight headaches per day; and C) Patient  has tried at least one standard prophylactic (preventive) pharmacologic therapy for cluster headache; and Note: Examples of standard prophylactic (preventive) pharmacologic therapies for cluster headache include lithium, verapamil, melatonin, frovatriptan, prednisone, suboccipital steroid injection, topiramate, and valproate. D) Patient has had inadequate efficacy or has experienced adverse event(s) severe enough to warrant discontinuation of the standard [facility] on behalf of your employer planprophylactic (preventive) pharmacologic therapy, according to the prescriber; and E) Preferred product criteria is met - Failure, contraindication, or intolerance to one of the following: i.  sumatriptan injectable; or ii. zolmitriptan nasal spray [may require prior authorization]. 2. Migraine Headache Prevention. Approve for 1 year if the patient meets all of the following (A, B, and C): A)  Patient is greater than or equal to 58 years of age; and B) Patient has greater than or equal to 4 migraine headache days per month (prior to initiating a migraine-preventive medication); and C) If   the patient is currently taking Emgality, the patient has had a significant clinical benefit from the medication, as determined by the prescriber. Note: Examples of significant clinical benefit include a reduction in the overall number of migraine days per month or a reduction in number of severe migraine days per month from the time that Emgality was initiated. When coverage is available and medically necessary, the dosage, frequency, duration of therapy, and site of care should be reasonable, clinically appropriate, and supported by evidence-based literature and adjusted based upon severity, alternative available treatments, and previous response to therapy. Receipt of sample product does not satisfy any criteria requirements for coverage. Any other use is considered experimental, investigational, or unproven, including the following (this list may not be  all inclusive; criteria will be updated as new published data are available): 1. Acute Treatment of Migraine. 2. Concurrent use with another calcitonin gene-related peptide (CGRP) inhibitor being prescribed for migraine headache prevention. 3. Concurrent use with Nurtec ODT (rimegepant sulfate orally disintegrating tablet) when used as a preventive treatment of migraine.

## 2023-06-03 NOTE — Telephone Encounter (Signed)
Called [facility] at [phone]. Spoke w/ tech. Pt has never picked up rx Emgality. Not showing PA needed. An test claim and cost for loading dose (2 pens for first month) is 60.00.     Pt criteria she has to meet: 2. Migraine Headache Prevention. Approve for 1 year if the patient meets all of the following (A, B, and C):  A) Patient is greater than or equal to 35 years of age; and (she meets this) B) Patient has greater than or equal to 4 migraine headache days per month (prior to initiating a migraine-preventive medication); and (she meets this) C) If the patient is currently taking Emgality, the patient has had a significant clinical benefit from the medication, as determined by the prescriber. Note: Examples of significant clinical benefit include a reduction in the overall number of migraine days per month or a reduction in number of severe migraine days per month from the time that Emgality was initiated. When coverage is available and medically necessary, the dosage, frequency, duration of therapy, and site of care should be reasonable, clinically appropriate, and supported by evidence-based literature and adjusted based upon severity, alternative available treatments, and previous response to therapy. Receipt of sample product does not satisfy any criteria requirements for coverage. Any other use is considered experimental, investigational, or unproven, including the following (this list may not be all inclusive; criteria will be updated as new published data are available): (not applicable since she has not started yet)

## 2023-06-04 ENCOUNTER — Other Ambulatory Visit (HOSPITAL_COMMUNITY): Payer: Self-pay

## 2023-06-04 NOTE — Telephone Encounter (Signed)
When I run a test claim, it shows that there is a paid claim that was filled yesterday

## 2023-06-04 NOTE — Telephone Encounter (Signed)
Noted  

## 2023-10-26 NOTE — Patient Instructions (Incomplete)
 Below is our plan:  We will restart Emgality. Take loader dose as directed and then continue 1 injection every 30 days. Continue rizatriptan as needed. I have called in promethazine 12.5mg  that you can take up to every 6 hours as needed but please avoid regular use.   Let me know if you do not hear back regarding Emgality approval.   Please make sure you are staying well hydrated. I recommend 50-60 ounces daily. Well balanced diet and regular exercise encouraged. Consistent sleep schedule with 6-8 hours recommended.   Please continue follow up with care team as directed.   Follow up with me in 6 months   You may receive a survey regarding today's visit. I encourage you to leave honest feed back as I do use this information to improve patient care. Thank you for seeing me today!   GENERAL HEADACHE INFORMATION:   Natural supplements: Magnesium Oxide or Magnesium Glycinate 500 mg at bed (up to 800 mg daily) Coenzyme Q10 300 mg in AM Vitamin B2- 200 mg twice a day   Add 1 supplement at a time since even natural supplements can have undesirable side effects. You can sometimes buy supplements cheaper (especially Coenzyme Q10) at www.WebmailGuide.co.za or at [facility].  Migraine with aura: There is increased risk for stroke in women with migraine with aura and a contraindication for the combined contraceptive pill for use by women who have migraine with aura. The risk for women with migraine without aura is lower. However other risk factors like smoking are far more likely to increase stroke risk than migraine. There is a recommendation for no smoking and for the use of OCPs without estrogen such as progestogen only pills particularly for women with migraine with aura.Marland Kitchen People who have migraine headaches with auras may be 3 times more likely to have a stroke caused by a blood clot, compared to migraine patients who don't see auras. Women who take hormone-replacement therapy may be 30 percent more likely to  suffer a clot-based stroke than women not taking medication containing estrogen. Other risk factors like smoking and high blood pressure may be  much more important.    Vitamins and herbs that show potential:   Magnesium: Magnesium (250 mg twice a day or 500 mg at bed) has a relaxant effect on smooth muscles such as blood vessels. Individuals suffering from frequent or daily headache usually have low magnesium levels which can be increase with daily supplementation of 400-750 mg. Three trials found 40-90% average headache reduction  when used as a preventative. Magnesium may help with headaches are aura, the best evidence for magnesium is for migraine with aura is its thought to stop the cortical spreading depression we believe is the pathophysiology of migraine aura.Magnesium also demonstrated the benefit in menstrually related migraine.  Magnesium is part of the messenger system in the serotonin cascade and it is a good muscle relaxant.  It is also useful for constipation which can be a side effect of other medications used to treat migraine. Good sources include nuts, whole grains, and tomatoes. Side Effects: loose stool/diarrhea  Riboflavin (vitamin B 2) 200 mg twice a day. This vitamin assists nerve cells in the production of ATP a principal energy storing molecule.  It is necessary for many chemical reactions in the body.  There have been at least 3 clinical trials of riboflavin using 400 mg per day all of which suggested that migraine frequency can be decreased.  All 3 trials showed significant improvement in over  half of migraine sufferers.  The supplement is found in bread, cereal, milk, meat, and poultry.  Most Americans get more riboflavin than the recommended daily allowance, however riboflavin deficiency is not necessary for the supplements to help prevent headache. Side effects: energizing, green urine   Coenzyme Q10: This is present in almost all cells in the body and is critical component for  the conversion of energy.  Recent studies have shown that a nutritional supplement of CoQ10 can reduce the frequency of migraine attacks by improving the energy production of cells as with riboflavin.  Doses of 150 mg twice a day have been shown to be effective.   Melatonin: Increasing evidence shows correlation between melatonin secretion and headache conditions.  Melatonin supplementation has decreased headache intensity and duration.  It is widely used as a sleep aid.  Sleep is natures way of dealing with migraine.  A dose of 3 mg is recommended to start for headaches including cluster headache. Higher doses up to 15 mg has been reviewed for use in Cluster headache and have been used. The rationale behind using melatonin for cluster is that many theories regarding the cause of Cluster headache center around the disruption of the normal circadian rhythm in the brain.  This helps restore the normal circadian rhythm.   HEADACHE DIET: Foods and beverages which may trigger migraine Note that only 20% of headache patients are food sensitive. You will know if you are food sensitive if you get a headache consistently 20 minutes to 2 hours after eating a certain food. Only cut out a food if it causes headaches, otherwise you might remove foods you enjoy! What matters most for diet is to eat a well balanced healthy diet full of vegetables and low fat protein, and to not miss meals.   Chocolate, other sweets ALL cheeses except cottage and cream cheese Dairy products, yogurt, sour cream, ice cream Liver Meat extracts (Bovril, Marmite, meat tenderizers) Meats or fish which have undergone aging, fermenting, pickling or smoking. These include: Hotdogs,salami,Lox,sausage, mortadellas,smoked salmon, pepperoni, Pickled herring Pods of broad bean (English beans, Chinese pea pods, [place] (fava) beans, lima and navy beans Ripe avocado, ripe banana Yeast extracts or active yeast preparations such as Brewer's or  Fleishman's (commercial bakes goods are permitted) Tomato based foods, pizza (lasagna, etc.)   MSG (monosodium glutamate) is disguised as many things; look for these common aliases: Monopotassium glutamate Autolysed yeast Hydrolysed protein Sodium caseinate "flavorings" "all natural preservatives" Nutrasweet   Avoid all other foods that convincingly provoke headaches.   Resources: The Dizzy [place]air Laundry Your Headache Diet, migrainestrong.com  https://zamora-andrews.com/   Caffeine and Migraine For patients that have migraine, caffeine intake more than 3 days per week can lead to dependency and increased migraine frequency. I would recommend cutting back on your caffeine intake as best you can. The recommended amount of caffeine is 200-300 mg daily, although migraine patients may experience dependency at even lower doses. While you may notice an increase in headache temporarily, cutting back will be helpful for headaches in the long run. For more information on caffeine and migraine, visit: https://americanmigrainefoundation.org/resource-library/caffeine-and-migraine/   Headache Prevention Strategies:   1. Maintain a headache diary; learn to identify and avoid triggers.  - This can be a simple note where you log when you had a headache, associated symptoms, and medications used - There are several smartphone apps developed to help track migraines: Migraine Buddy, Migraine Monitor, Curelator N1-Headache App   Common triggers include: Emotional triggers: Emotional/Upset family or  friends Emotional/Upset occupation Business reversal/success Anticipation anxiety Crisis-serious Post-crisis periodNew job/position   Physical triggers: Vacation Day Weekend Strenuous Exercise High Altitude Location New Move Menstrual Day Physical Illness Oversleep/Not enough sleep Weather changes Light: Photophobia or light sesnitivity treatment involves  a balance between desensitization and reduction in overly strong input. Use dark polarized glasses outside, but not inside. Avoid bright or fluorescent light, but do not dim environment to the point that going into a normally lit room hurts. Consider FL-41 tint lenses, which reduce the most irritating wavelengths without blocking too much light.  These can be obtained at axonoptics.com or theraspecs.com Foods: see list above.   2. Limit use of acute treatments (over-the-counter medications, triptans, etc.) to no more than 2 days per week or 10 days per month to prevent medication overuse headache (rebound headache).     3. Follow a regular schedule (including weekends and holidays): Don't skip meals. Eat a balanced diet. 8 hours of sleep nightly. Minimize stress. Exercise 30 minutes per day. Being overweight is associated with a 5 times increased risk of chronic migraine. Keep well hydrated and drink 6-8 glasses of water per day.   4. Initiate non-pharmacologic measures at the earliest onset of your headache. Rest and quiet environment. Relax and reduce stress. Breathe2Relax is a free app that can instruct you on    some simple relaxtion and breathing techniques. Http://Dawnbuse.com is a    free website that provides teaching videos on relaxation.  Also, there are  many apps that   can be downloaded for "mindful" relaxation.  An app called YOGA NIDRA will help walk you through mindfulness. Another app called Calm can be downloaded to give you a structured mindfulness guide with daily reminders and skill development. Headspace for guided meditation Mindfulness Based Stress Reduction Online Course: www.palousemindfulness.com Cold compresses.   5. Don't wait!! Take the maximum allowable dosage of prescribed medication at the first sign of migraine.   6. Compliance:  Take prescribed medication regularly as directed and at the first sign of a migraine.   7. Communicate:  Call your physician when  problems arise, especially if your headaches change, increase in frequency/severity, or become associated with neurological symptoms (weakness, numbness, slurred speech, etc.). Proceed to emergency room if you experience new or worsening symptoms or symptoms do not resolve, if you have new neurologic symptoms or if headache is severe, or for any concerning symptom.   8. Headache/pain management therapies: Consider various complementary methods, including medication, behavioral therapy, psychological counselling, biofeedback, massage therapy, acupuncture, dry needling, and other modalities.  Such measures may reduce the need for medications. Counseling for pain management, where patients learn to function and ignore/minimize their pain, seems to work very well.   9. Recommend changing family's attention and focus away from patient's headaches. Instead, emphasize daily activities. If first question of day is 'How are your headaches/Do you have a headache today?', then patient will constantly think about headaches, thus making them worse. Goal is to re-direct attention away from headaches, toward daily activities and other distractions.   10. Helpful Websites: www.AmericanHeadacheSociety.org PatentHood.ch www.headaches.org TightMarket.nl www.achenet.org

## 2023-10-26 NOTE — Progress Notes (Unsigned)
 No chief complaint on file.   HISTORY OF PRESENT ILLNESS:  10/26/23 ALL:  Shirley Mendez is a 38 y.o. female here today for follow up for migraines. She was seen in consult with Dr Delena Bali 09/2022 and started on propranolol and rizatriptan. She was seen in follow up 04/2023 and reported improvement of headaches but having heat intolerance and dizziness on propranolol. She was started on Emgality and advised to wean propranolol. Rizatriptan continued for abortive therapy.   Since,    HISTORY (copied from Dr Quentin Mulling previous note)  38 year old female with a history of psoriasis, anxiety, OCD, hypothyroidism, hyperlipidemia, lumbar radiculopathy who follows in clinic for migraines. [facility] 10/09/22 was unremarkable.   At her last visit she was started on propranolol for migraine prevention. Maxalt was continued for rescue.   Interval History: Headaches have improved with propranolol. She does start to get a headache when she is due for her next dose of propranolol. Has noticed dizziness and heat intolerance since starting the medication. Maxalt works well for rescue.   Current Headache Regimen: Preventative: propranolol 20 mg BID Abortive: Maxalt 5 mg PRN     Prior Therapies                                  Rescue: Ibuprofen Tylenol Maxalt 5-10 mg PRN Zofran 4 mg PRN Flexeril 10 mg TID PRN Soma 350 mg PRN   Prevention: Fluvoxamine 25 mg BID - lack of efficacy Topamax - lack of efficacy Propranolol 20 mg BID - dizziness   REVIEW OF SYSTEMS: Out of a complete 14 system review of symptoms, the patient complains only of the following symptoms, and all other reviewed systems are negative.   ALLERGIES: Allergies  Allergen Reactions   Bactrim [Sulfamethoxazole-Trimethoprim] Swelling    Swelling & tingling   Nsaids     Purple rash   Amoxicillin      HOME MEDICATIONS: Outpatient Medications Prior to Visit  Medication Sig Dispense Refill   amitriptyline (ELAVIL) 25 MG tablet  Take 25 mg by mouth at bedtime. (Patient not taking: Reported on 09/10/2022)     cyclobenzaprine (FLEXERIL) 10 MG tablet Take 10 mg by mouth 3 (three) times daily as needed for muscle spasms.     EPINEPHrine 0.3 mg/0.3 mL IJ SOAJ injection USE FOR anaphylactic OR allergic reaction  99   fluvoxaMINE (LUVOX) 25 MG tablet Take 25 mg by mouth 2 (two) times daily.     Galcanezumab-gnlm (EMGALITY) 120 MG/ML SOAJ Inject 1 Pen into the skin every 30 (thirty) days. 1.12 mL 6   HYDROcodone-acetaminophen (NORCO/VICODIN) 5-325 MG tablet Take 1 tablet by mouth 3 (three) times daily as needed.     levocetirizine (XYZAL) 5 MG tablet Take 5 mg by mouth every evening.     levothyroxine (SYNTHROID) 50 MCG tablet Take 50 mcg by mouth daily before breakfast.     methocarbamol (ROBAXIN) 500 MG tablet Take 500 mg by mouth every 8 (eight) hours as needed.     ondansetron (ZOFRAN) 4 MG tablet Take 4 mg by mouth every 8 (eight) hours as needed.     pravastatin (PRAVACHOL) 20 MG tablet Take 20 mg by mouth at bedtime.     propranolol (INDERAL) 20 MG tablet TAKE 1 TABLET BY MOUTH TWICE DAILY 60 tablet 6   rizatriptan (MAXALT) 10 MG tablet Take 0.5 tablets (5 mg total) by mouth as needed for migraine. May repeat  in 2 hours if needed 10 tablet 6   No facility-administered medications prior to visit.     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   Hyperlipidemia    Irregular bleeding 03/08/2015   Left ovarian cyst 09/29/2016   Recheck in 4 weeks    LLQ pain 03/08/2015   Migraine with aura    Missed periods 07/19/2015   Pelvic congestion syndrome    Post concussion syndrome    UTI (lower urinary tract infection)      PAST SURGICAL HISTORY: Past Surgical History:  Procedure Laterality Date   COLPOSCOPY  04/2011     FAMILY HISTORY: Family History  Problem Relation Age of  Onset   Asthma Son    Depression Maternal Grandmother    Heart disease Maternal Grandmother    Stroke Maternal Grandmother    Cancer Maternal Grandfather        kidney, lung   Lupus Paternal Grandmother    Other Mother        had a cyst removed from ovary   Eczema Maternal Uncle    Allergic rhinitis Neg Hx    Angioedema Neg Hx    Immunodeficiency Neg Hx    Urticaria Neg Hx      SOCIAL HISTORY: Social History   Socioeconomic History   Marital status: Single    Spouse name: Not on file   Number of children: Not on file   Years of education: Not on file   Highest education level: Not on file  Occupational History   Not on file  Tobacco Use   Smoking status: Former    Current packs/day: 0.00    Average packs/day: 0.5 packs/day for 13.0 years (6.5 ttl pk-yrs)    Types: Cigarettes    Start date: 07/02/2006    Quit date: 07/03/2019    Years since quitting: 4.3   Smokeless tobacco: Never  Substance and Sexual Activity   Alcohol use: No    Comment: rarely; not now   Drug use: No   Sexual activity: Yes    Birth control/protection: None  Other Topics Concern   Not on file  Social History Narrative   ** Merged History Encounter **       Social Drivers of Health   Financial Resource Strain: Low Risk  (12/17/2020)   Received from [employer], [facility]   Overall Financial Resource Strain (CARDIA)    Difficulty of Paying Living Expenses: Not hard at all  Food Insecurity: Not on file  Transportation Needs: Not on file  Physical Activity: Not on file  Stress: No Stress Concern Present (12/17/2020)   Received from [facility], [facility]   [employer] of Occupational Health - Occupational Stress Questionnaire    Feeling of Stress : Not at all  Social Connections: Unknown (01/11/2022)   [facility] from [facility], [facility]   Social Network    Social Network: Not on file  Intimate Partner Violence: Unknown (12/05/2021)   Received from [facility],  [facility]   HITS    Physically Hurt: Not on file    Insult or Talk Down To: Not on file    Threaten Physical Harm: Not on file    Scream or Curse: Not on file     PHYSICAL EXAM  There were no vitals filed for this visit. There is  no height or weight on file to calculate BMI.  Generalized: Well developed, in no acute distress  Cardiology: normal rate and rhythm, no murmur auscultated  Respiratory: clear to auscultation bilaterally    Neurological examination  Mentation: Alert oriented to time, place, history taking. Follows all commands speech and language fluent Cranial nerve II-XII: Pupils were equal round reactive to light. Extraocular movements were full, visual field were full on confrontational test. Facial sensation and strength were normal. Uvula tongue midline. Head turning and shoulder shrug  were normal and symmetric. Motor: The motor testing reveals 5 over 5 strength of all 4 extremities. Good symmetric motor tone is noted throughout.  Sensory: Sensory testing is intact to soft touch on all 4 extremities. No evidence of extinction is noted.  Coordination: Cerebellar testing reveals good finger-nose-finger and heel-to-shin bilaterally.  Gait and station: Gait is normal. Tandem gait is normal. Romberg is negative. No drift is seen.  Reflexes: Deep tendon reflexes are symmetric and normal bilaterally.    DIAGNOSTIC DATA (LABS, IMAGING, TESTING) - I reviewed patient records, labs, notes, testing and imaging myself where available.  Lab Results  Component Value Date   WBC 10.7 (H) 05/16/2019   HGB 14.6 05/16/2019   HCT 43.0 05/16/2019   MCV 92.5 05/16/2019   PLT 254 05/16/2019      Component Value Date/Time   NA 139 06/06/2014 0911   K 4.3 06/06/2014 0911   CL 105 06/06/2014 0911   CO2 26 06/06/2014 0911   GLUCOSE 88 06/06/2014 0911   BUN 12 06/06/2014 0911   CREATININE 0.69 06/06/2014 0911   CALCIUM 9.8 06/06/2014 0911   PROT 7.4 06/06/2014 0911   ALBUMIN  4.3 06/06/2014 0911   AST 14 06/06/2014 0911   ALT 11 06/06/2014 0911   ALKPHOS 45 06/06/2014 0911   BILITOT 0.4 06/06/2014 0911   GFRNONAA >90 12/27/2013 1118   GFRAA >90 12/27/2013 1118   Lab Results  Component Value Date   CHOL 206 (H) 06/06/2014   HDL 44 06/06/2014   LDLCALC 126 (H) 06/06/2014   TRIG 178 (H) 06/06/2014   CHOLHDL 4.7 06/06/2014   No results found for: "HGBA1C" No results found for: "VITAMINB12" Lab Results  Component Value Date   TSH 2.605 06/06/2014        No data to display               No data to display           ASSESSMENT AND PLAN  38 y.o. year old female  has a past medical history of Abnormal Pap smear of cervix, Contraceptive management (03/08/2015), Eczema, History of HPV infection (12/06/2015), Hyperlipidemia, Irregular bleeding (03/08/2015), Left ovarian cyst (09/29/2016), LLQ pain (03/08/2015), Migraine with aura, Missed periods (07/19/2015), Pelvic congestion syndrome, Post concussion syndrome, and UTI (lower urinary tract infection). here with    No diagnosis found.  Shirley Mendez ***.  Healthy lifestyle habits encouraged. *** will follow up with PCP as directed. *** will return to see me in ***, sooner if needed. *** verbalizes understanding and agreement with this plan.   No orders of the defined types were placed in this encounter.    No orders of the defined types were placed in this encounter.    Shawnie Dapper, MSN, FNP-C 10/26/2023, 10:11 AM  [facility] [address], [address] [place], [place] [address] [phone]

## 2023-10-27 ENCOUNTER — Encounter: Payer: Self-pay | Admitting: Family Medicine

## 2023-10-27 ENCOUNTER — Ambulatory Visit: Payer: Managed Care, Other (non HMO) | Admitting: Family Medicine

## 2023-10-27 VITALS — BP 132/87 | HR 94 | Ht 69.0 in | Wt 192.0 lb

## 2023-10-27 DIAGNOSIS — R112 Nausea with vomiting, unspecified: Secondary | ICD-10-CM

## 2023-10-27 DIAGNOSIS — G43119 Migraine with aura, intractable, without status migrainosus: Secondary | ICD-10-CM | POA: Diagnosis not present

## 2023-10-27 DIAGNOSIS — G44309 Post-traumatic headache, unspecified, not intractable: Secondary | ICD-10-CM | POA: Diagnosis not present

## 2023-10-27 MED ORDER — EMGALITY 120 MG/ML ~~LOC~~ SOAJ: SUBCUTANEOUS | 0 refills | Status: AC

## 2023-10-27 MED ORDER — PROMETHAZINE HCL 12.5 MG PO TABS: 12.5000 mg | ORAL_TABLET | Freq: Four times a day (QID) | ORAL | 0 refills | Status: DC | PRN

## 2023-10-31 ENCOUNTER — Encounter: Payer: Self-pay | Admitting: Family Medicine

## 2023-11-02 MED ORDER — EMGALITY 120 MG/ML ~~LOC~~ SOAJ: 120.0000 mg | SUBCUTANEOUS | 3 refills | Status: DC

## 2024-04-06 ENCOUNTER — Telehealth: Payer: Self-pay

## 2024-04-06 ENCOUNTER — Other Ambulatory Visit (HOSPITAL_COMMUNITY): Payer: Self-pay

## 2024-04-06 NOTE — Telephone Encounter (Signed)
 Pharmacy Patient Advocate Encounter   Received notification from Fax that prior authorization for Emgality  120MG /ML auto-injectors (migraine) is required/requested.   Insurance verification completed.   The patient is insured through [employer] .   Per test claim: PA required; PA submitted to above mentioned insurance via CoverMyMeds Key/confirmation #/EOC [id number] Status is pending

## 2024-04-07 ENCOUNTER — Other Ambulatory Visit (HOSPITAL_COMMUNITY): Payer: Self-pay

## 2024-04-14 NOTE — Telephone Encounter (Signed)
 Pharmacy Patient Advocate Encounter  Received notification from [employer] that Prior Authorization for Emgality has been APPROVED from 04/06/2024 to 04/10/2025   PA #/Case ID/Reference #: [id number]

## 2024-05-10 NOTE — Progress Notes (Unsigned)
 No chief complaint on file.   HISTORY OF PRESENT ILLNESS:  05/10/24 ALL:  Shirley Mendez returns for follow up for migraines. She was last seen 10/2023 and we restarted Emgality . Since,   10/27/2023 ALL:  Shirley Mendez is a 38 y.o. female here today for follow up for migraines. She was seen in consult with Dr Rush 09/2022 and started on propranolol  and rizatriptan . She was seen in follow up 04/2023 and reported improvement of headaches but having heat intolerance and dizziness on propranolol . She was started on Emgality  and advised to wean propranolol . Rizatriptan  continued for abortive therapy.   Since, she reports taking starter dose in 06/2023. Migraines seemed to improve significant . Now having daily headaches with at least 8-10 migraines per month. She uses Tylenol , mostly for abortive therapy. Rizatriptan  works well but makes her tired. She did feel migraines were much less severe after taking Emgality . She has significant nausea. Ondansetron  does not usually help. Phenergan  does. May take 2-3 times a month at most. She is a paramedic. She is followed by GYN for post partum depression. Taking Fluvox but not sure it is helping. She denies concerns of pregnancy. Not on birth control but husband planning vasectomy soon.    HISTORY (copied from Dr Merna previous note)  38 year old female with a history of psoriasis, anxiety, OCD, hypothyroidism, hyperlipidemia, lumbar radiculopathy who follows in clinic for migraines. [facility] 10/09/22 was unremarkable.   At her last visit she was started on propranolol  for migraine prevention. Maxalt  was continued for rescue.   Interval History: Headaches have improved with propranolol . She does start to get a headache when she is due for her next dose of propranolol . Has noticed dizziness and heat intolerance since starting the medication. Maxalt  works well for rescue.   Current Headache Regimen: Preventative: propranolol  20 mg BID Abortive: Maxalt  5 mg PRN      Prior Therapies                                  Rescue: Ibuprofen  Tylenol  Maxalt  5-10 mg PRN Zofran  4 mg PRN Flexeril 10 mg TID PRN Soma 350 mg PRN   Prevention: Fluvoxamine 25 mg BID - lack of efficacy Topamax - lack of efficacy Propranolol  20 mg BID - dizziness   REVIEW OF SYSTEMS: Out of a complete 14 system review of symptoms, the patient complains only of the following symptoms, migraines, nausea, and all other reviewed systems are negative.   ALLERGIES: Allergies  Allergen Reactions   Bactrim  [Sulfamethoxazole -Trimethoprim ] Swelling    Swelling & tingling   Nsaids     Purple rash   Amoxicillin    Pravastatin     myalgia     HOME MEDICATIONS: Outpatient Medications Prior to Visit  Medication Sig Dispense Refill   EPINEPHrine  0.3 mg/0.3 mL IJ SOAJ injection USE FOR anaphylactic OR allergic reaction  99   fluvoxaMINE (LUVOX) 25 MG tablet Take 25 mg by mouth 2 (two) times daily.     Galcanezumab -gnlm (EMGALITY ) 120 MG/ML SOAJ Take two injections (240mg ), subcu, for loader dose 2 mL 0   Galcanezumab -gnlm (EMGALITY ) 120 MG/ML SOAJ Inject 120 mg into the skin every 30 (thirty) days. 1.12 mL 3   levocetirizine (XYZAL) 5 MG tablet Take 5 mg by mouth every evening.     levothyroxine (SYNTHROID) 50 MCG tablet Take 50 mcg by mouth daily before breakfast.     methocarbamol (ROBAXIN) 500 MG  tablet Take 500 mg by mouth every 8 (eight) hours as needed.     ondansetron  (ZOFRAN ) 4 MG tablet Take 4 mg by mouth every 8 (eight) hours as needed.     promethazine  (PHENERGAN ) 12.5 MG tablet Take 1 tablet (12.5 mg total) by mouth every 6 (six) hours as needed for nausea or vomiting. 10 tablet 0   rizatriptan  (MAXALT ) 10 MG tablet Take 0.5 tablets (5 mg total) by mouth as needed for migraine. May repeat in 2 hours if needed 10 tablet 6   No facility-administered medications prior to visit.     PAST MEDICAL HISTORY: Past Medical History:  Diagnosis Date   Abnormal Pap smear of  cervix    Contraceptive management 03/08/2015   Eczema    History of HPV infection 12/06/2015   Hyperlipidemia    Irregular bleeding 03/08/2015   Left ovarian cyst 09/29/2016   Recheck in 4 weeks    LLQ pain 03/08/2015   Migraine with aura    Missed periods 07/19/2015   Pelvic congestion syndrome    Post concussion syndrome    UTI (lower urinary tract infection)      PAST SURGICAL HISTORY: Past Surgical History:  Procedure Laterality Date   COLPOSCOPY  04/2011     FAMILY HISTORY: Family History  Problem Relation Age of Onset   Asthma Son    Depression Maternal Grandmother    Heart disease Maternal Grandmother    Stroke Maternal Grandmother    Cancer Maternal Grandfather        kidney, lung   Lupus Paternal Grandmother    Other Mother        had a cyst removed from ovary   Eczema Maternal Uncle    Allergic rhinitis Neg Hx    Angioedema Neg Hx    Immunodeficiency Neg Hx    Urticaria Neg Hx      SOCIAL HISTORY: Social History   Socioeconomic History   Marital status: Single    Spouse name: Not on file   Number of children: Not on file   Years of education: Not on file   Highest education level: Not on file  Occupational History   Not on file  Tobacco Use   Smoking status: Former    Current packs/day: 0.00    Average packs/day: 0.5 packs/day for 13.0 years (6.5 ttl pk-yrs)    Types: Cigarettes    Start date: 07/02/2006    Quit date: 07/03/2019    Years since quitting: 4.8   Smokeless tobacco: Never  Substance and Sexual Activity   Alcohol use: No    Comment: rarely; not now   Drug use: No   Sexual activity: Yes    Birth control/protection: None  Other Topics Concern   Not on file  Social History Narrative   ** Merged History Encounter **       Social Drivers of Health   Financial Resource Strain: Low Risk  (12/17/2020)   Received from [facility]   Overall Financial Resource Strain (CARDIA)    Difficulty of Paying Living Expenses: Not hard  at all  Food Insecurity: Not on file  Transportation Needs: Not on file  Physical Activity: Not on file  Stress: No Stress Concern Present (12/17/2020)   Received from [facility] - Occupational Stress Questionnaire    Feeling of Stress : Not at all  Social Connections: Unknown (01/11/2022)   Received from [facility]   Social Network  Social Network: Not on file  Intimate Partner Violence: Unknown (12/05/2021)   Received from [facility]   HITS    Physically Hurt: Not on file    Insult or Talk Down To: Not on file    Threaten Physical Harm: Not on file    Scream or Curse: Not on file     PHYSICAL EXAM  There were no vitals filed for this visit.  There is no height or weight on file to calculate BMI.  Generalized: Well developed, in no acute distress  Cardiology: normal rate and rhythm, no murmur auscultated  Respiratory: clear to auscultation bilaterally    Neurological examination  Mentation: Alert oriented to time, place, history taking. Follows all commands speech and language fluent Cranial nerve II-XII: Pupils were equal round reactive to light. Extraocular movements were full, visual field were full on confrontational test. Facial sensation and strength were normal. Uvula tongue midline. Head turning and shoulder shrug  were normal and symmetric. Motor: The motor testing reveals 5 over 5 strength of all 4 extremities. Good symmetric motor tone is noted throughout.  Gait and station: Gait is normal.    DIAGNOSTIC DATA (LABS, IMAGING, TESTING) - I reviewed patient records, labs, notes, testing and imaging myself where available.  Lab Results  Component Value Date   WBC 10.7 (H) 05/16/2019   HGB 14.6 05/16/2019   HCT 43.0 05/16/2019   MCV 92.5 05/16/2019   PLT 254 05/16/2019      Component Value Date/Time   NA 139 06/06/2014 0911   K 4.3 06/06/2014 0911   CL 105 06/06/2014 0911   CO2 26 06/06/2014 0911    GLUCOSE 88 06/06/2014 0911   BUN 12 06/06/2014 0911   CREATININE 0.69 06/06/2014 0911   CALCIUM 9.8 06/06/2014 0911   PROT 7.4 06/06/2014 0911   ALBUMIN 4.3 06/06/2014 0911   AST 14 06/06/2014 0911   ALT 11 06/06/2014 0911   ALKPHOS 45 06/06/2014 0911   BILITOT 0.4 06/06/2014 0911   GFRNONAA >90 12/27/2013 1118   GFRAA >90 12/27/2013 1118   Lab Results  Component Value Date   CHOL 206 (H) 06/06/2014   HDL 44 06/06/2014   LDLCALC 126 (H) 06/06/2014   TRIG 178 (H) 06/06/2014   CHOLHDL 4.7 06/06/2014   No results found for: HGBA1C No results found for: VITAMINB12 Lab Results  Component Value Date   TSH 2.605 06/06/2014        No data to display               No data to display           ASSESSMENT AND PLAN  38 y.o. year old female  has a past medical history of Abnormal Pap smear of cervix, Contraceptive management (03/08/2015), Eczema, History of HPV infection (12/06/2015), Hyperlipidemia, Irregular bleeding (03/08/2015), Left ovarian cyst (09/29/2016), LLQ pain (03/08/2015), Migraine with aura, Missed periods (07/19/2015), Pelvic congestion syndrome, Post concussion syndrome, and UTI (lower urinary tract infection). here with    No diagnosis found.  Shirley Mendez reports headaches had improved on Emgality  but she did not continue due to thinking it was denied. I will resubmit orders to resume Emgality  stating with loading dose of two injections then continuing 1 injection every 30 days. She will continue low dose rizatriptan  and I have called in a limited quantity of phenergan  for PRN use. She is aware to limit OTC use of Tylenol  to 1-2 times weekly. Healthy lifestyle habits encouraged. She will follow up  with PCP as directed. She will return to see me in 6 months, sooner if needed. She verbalizes understanding and agreement with this plan.   No orders of the defined types were placed in this encounter.    No orders of the defined types were placed in this  encounter.   I spent 30 minutes of face-to-face and non-face-to-face time with patient.  This included previsit chart review, lab review, study review, order entry, electronic health record documentation, patient education.   Greig Forbes, MSN, FNP-C 05/10/2024, 4:19 PM  [facility] [address], [address] [place], [place] [address] [phone]

## 2024-05-10 NOTE — Patient Instructions (Signed)
 Below is our plan:  We will try you on Qulipta  60mg  daily. Continue rizatriptan  5mg  as needed. Please take 1 tablet at onset of headache. May take 1 additional tablet in 2 hours if needed. Do not take more than 2 tablets in 24 hours or more than 10 in a month. Continue phenergan  as needed for nausea. We will order an MRI for evaluation.   Please make sure you are staying well hydrated. I recommend 50-60 ounces daily. Well balanced diet and regular exercise encouraged. Consistent sleep schedule with 6-8 hours recommended.   Please continue follow up with care team as directed.   Follow up with me in 6 months   You may receive a survey regarding today's visit. I encourage you to leave honest feed back as I do use this information to improve patient care. Thank you for seeing me today!   GENERAL HEADACHE INFORMATION:   Natural supplements: Magnesium Oxide or Magnesium Glycinate 500 mg at bed (up to 800 mg daily) Coenzyme Q10 300 mg in AM Vitamin B2- 200 mg twice a day   Add 1 supplement at a time since even natural supplements can have undesirable side effects. You can sometimes buy supplements cheaper (especially Coenzyme Q10) at www.WebmailGuide.co.za or at [facility].  Migraine with aura: There is increased risk for stroke in women with migraine with aura and a contraindication for the combined contraceptive pill for use by women who have migraine with aura. The risk for women with migraine without aura is lower. However other risk factors like smoking are far more likely to increase stroke risk than migraine. There is a recommendation for no smoking and for the use of OCPs without estrogen such as progestogen only pills particularly for women with migraine with aura.SABRA People who have migraine headaches with auras may be 3 times more likely to have a stroke caused by a blood clot, compared to migraine patients who don't see auras. Women who take hormone-replacement therapy may be 30 percent more likely to  suffer a clot-based stroke than women not taking medication containing estrogen. Other risk factors like smoking and high blood pressure may be  much more important.    Vitamins and herbs that show potential:   Magnesium: Magnesium (250 mg twice a day or 500 mg at bed) has a relaxant effect on smooth muscles such as blood vessels. Individuals suffering from frequent or daily headache usually have low magnesium levels which can be increase with daily supplementation of 400-750 mg. Three trials found 40-90% average headache reduction  when used as a preventative. Magnesium may help with headaches are aura, the best evidence for magnesium is for migraine with aura is its thought to stop the cortical spreading depression we believe is the pathophysiology of migraine aura.Magnesium also demonstrated the benefit in menstrually related migraine.  Magnesium is part of the messenger system in the serotonin cascade and it is a good muscle relaxant.  It is also useful for constipation which can be a side effect of other medications used to treat migraine. Good sources include nuts, whole grains, and tomatoes. Side Effects: loose stool/diarrhea  Riboflavin (vitamin B 2) 200 mg twice a day. This vitamin assists nerve cells in the production of ATP a principal energy storing molecule.  It is necessary for many chemical reactions in the body.  There have been at least 3 clinical trials of riboflavin using 400 mg per day all of which suggested that migraine frequency can be decreased.  All 3 trials showed  significant improvement in over half of migraine sufferers.  The supplement is found in bread, cereal, milk, meat, and poultry.  Most Americans get more riboflavin than the recommended daily allowance, however riboflavin deficiency is not necessary for the supplements to help prevent headache. Side effects: energizing, green urine   Coenzyme Q10: This is present in almost all cells in the body and is critical component for  the conversion of energy.  Recent studies have shown that a nutritional supplement of CoQ10 can reduce the frequency of migraine attacks by improving the energy production of cells as with riboflavin.  Doses of 150 mg twice a day have been shown to be effective.   Melatonin: Increasing evidence shows correlation between melatonin secretion and headache conditions.  Melatonin supplementation has decreased headache intensity and duration.  It is widely used as a sleep aid.  Sleep is natures way of dealing with migraine.  A dose of 3 mg is recommended to start for headaches including cluster headache. Higher doses up to 15 mg has been reviewed for use in Cluster headache and have been used. The rationale behind using melatonin for cluster is that many theories regarding the cause of Cluster headache center around the disruption of the normal circadian rhythm in the brain.  This helps restore the normal circadian rhythm.   HEADACHE DIET: Foods and beverages which may trigger migraine Note that only 20% of headache patients are food sensitive. You will know if you are food sensitive if you get a headache consistently 20 minutes to 2 hours after eating a certain food. Only cut out a food if it causes headaches, otherwise you might remove foods you enjoy! What matters most for diet is to eat a well balanced healthy diet full of vegetables and low fat protein, and to not miss meals.   Chocolate, other sweets ALL cheeses except cottage and cream cheese Dairy products, yogurt, sour cream, ice cream Liver Meat extracts (Bovril, Marmite, meat tenderizers) Meats or fish which have undergone aging, fermenting, pickling or smoking. These include: Hotdogs,salami,Lox,sausage, mortadellas,smoked salmon, pepperoni, Pickled herring Pods of broad bean (English beans, Chinese pea pods, [place] (fava) beans, lima and navy beans Ripe avocado, ripe banana Yeast extracts or active yeast preparations such as Brewer's or  Fleishman's (commercial bakes goods are permitted) Tomato based foods, pizza (lasagna, etc.)   MSG (monosodium glutamate) is disguised as many things; look for these common aliases: Monopotassium glutamate Autolysed yeast Hydrolysed protein Sodium caseinate "flavorings" "all natural preservatives Nutrasweet   Avoid all other foods that convincingly provoke headaches.   Resources: The Dizzy Bluford Aid Your Headache Diet, migrainestrong.com  https://zamora-andrews.com/   Caffeine and Migraine For patients that have migraine, caffeine intake more than 3 days per week can lead to dependency and increased migraine frequency. I would recommend cutting back on your caffeine intake as best you can. The recommended amount of caffeine is 200-300 mg daily, although migraine patients may experience dependency at even lower doses. While you may notice an increase in headache temporarily, cutting back will be helpful for headaches in the long run. For more information on caffeine and migraine, visit: https://americanmigrainefoundation.org/resource-library/caffeine-and-migraine/   Headache Prevention Strategies:   1. Maintain a headache diary; learn to identify and avoid triggers.  - This can be a simple note where you log when you had a headache, associated symptoms, and medications used - There are several smartphone apps developed to help track migraines: Migraine Buddy, Migraine Monitor, Curelator N1-Headache App   Common triggers include: Emotional  triggers: Emotional/Upset family or friends Emotional/Upset occupation Business reversal/success Anticipation anxiety Crisis-serious Post-crisis periodNew job/position   Physical triggers: Vacation Day Weekend Strenuous Exercise High Altitude Location New Move Menstrual Day Physical Illness Oversleep/Not enough sleep Weather changes Light: Photophobia or light sesnitivity treatment involves  a balance between desensitization and reduction in overly strong input. Use dark polarized glasses outside, but not inside. Avoid bright or fluorescent light, but do not dim environment to the point that going into a normally lit room hurts. Consider FL-41 tint lenses, which reduce the most irritating wavelengths without blocking too much light.  These can be obtained at axonoptics.com or theraspecs.com Foods: see list above.   2. Limit use of acute treatments (over-the-counter medications, triptans, etc.) to no more than 2 days per week or 10 days per month to prevent medication overuse headache (rebound headache).     3. Follow a regular schedule (including weekends and holidays): Don't skip meals. Eat a balanced diet. 8 hours of sleep nightly. Minimize stress. Exercise 30 minutes per day. Being overweight is associated with a 5 times increased risk of chronic migraine. Keep well hydrated and drink 6-8 glasses of water per day.   4. Initiate non-pharmacologic measures at the earliest onset of your headache. Rest and quiet environment. Relax and reduce stress. Breathe2Relax is a free app that can instruct you on    some simple relaxtion and breathing techniques. Http://Dawnbuse.com is a    free website that provides teaching videos on relaxation.  Also, there are  many apps that   can be downloaded for "mindful" relaxation.  An app called YOGA NIDRA will help walk you through mindfulness. Another app called Calm can be downloaded to give you a structured mindfulness guide with daily reminders and skill development. Headspace for guided meditation Mindfulness Based Stress Reduction Online Course: www.palousemindfulness.com Cold compresses.   5. Don't wait!! Take the maximum allowable dosage of prescribed medication at the first sign of migraine.   6. Compliance:  Take prescribed medication regularly as directed and at the first sign of a migraine.   7. Communicate:  Call your physician when  problems arise, especially if your headaches change, increase in frequency/severity, or become associated with neurological symptoms (weakness, numbness, slurred speech, etc.). Proceed to emergency room if you experience new or worsening symptoms or symptoms do not resolve, if you have new neurologic symptoms or if headache is severe, or for any concerning symptom.   8. Headache/pain management therapies: Consider various complementary methods, including medication, behavioral therapy, psychological counselling, biofeedback, massage therapy, acupuncture, dry needling, and other modalities.  Such measures may reduce the need for medications. Counseling for pain management, where patients learn to function and ignore/minimize their pain, seems to work very well.   9. Recommend changing family's attention and focus away from patient's headaches. Instead, emphasize daily activities. If first question of day is 'How are your headaches/Do you have a headache today?', then patient will constantly think about headaches, thus making them worse. Goal is to re-direct attention away from headaches, toward daily activities and other distractions.   10. Helpful Websites: www.AmericanHeadacheSociety.org PatentHood.ch www.headaches.org TightMarket.nl www.achenet.org

## 2024-05-11 ENCOUNTER — Encounter: Payer: Self-pay | Admitting: Family Medicine

## 2024-05-11 ENCOUNTER — Ambulatory Visit: Payer: Managed Care, Other (non HMO) | Admitting: Family Medicine

## 2024-05-11 VITALS — BP 120/82 | HR 98 | Ht 69.0 in | Wt 190.5 lb

## 2024-05-11 DIAGNOSIS — G4486 Cervicogenic headache: Secondary | ICD-10-CM | POA: Diagnosis not present

## 2024-05-11 DIAGNOSIS — G44309 Post-traumatic headache, unspecified, not intractable: Secondary | ICD-10-CM | POA: Diagnosis not present

## 2024-05-11 DIAGNOSIS — G43119 Migraine with aura, intractable, without status migrainosus: Secondary | ICD-10-CM

## 2024-05-11 DIAGNOSIS — R112 Nausea with vomiting, unspecified: Secondary | ICD-10-CM

## 2024-05-11 MED ORDER — RIZATRIPTAN BENZOATE 10 MG PO TABS: 5.0000 mg | ORAL_TABLET | ORAL | 6 refills | Status: AC | PRN

## 2024-05-11 MED ORDER — PROMETHAZINE HCL 12.5 MG PO TABS: 12.5000 mg | ORAL_TABLET | Freq: Four times a day (QID) | ORAL | 0 refills | Status: AC | PRN

## 2024-05-11 MED ORDER — QULIPTA 60 MG PO TABS: 60.0000 mg | ORAL_TABLET | Freq: Every day | ORAL | 3 refills | Status: AC

## 2024-05-16 ENCOUNTER — Telehealth: Payer: Self-pay

## 2024-05-16 ENCOUNTER — Other Ambulatory Visit (HOSPITAL_COMMUNITY): Payer: Self-pay

## 2024-05-16 NOTE — Telephone Encounter (Signed)
 Pharmacy Patient Advocate Encounter   Received notification from CoverMyMeds that prior authorization for QULIPTA  is required/requested.   Insurance verification completed.   The patient is insured through [employer] .   Per test claim: PA required; PA submitted to above mentioned insurance via Latent Key/confirmation #/EOC [id number] Status is pending

## 2024-05-17 ENCOUNTER — Other Ambulatory Visit (HOSPITAL_COMMUNITY): Payer: Self-pay

## 2024-05-17 ENCOUNTER — Ambulatory Visit (INDEPENDENT_AMBULATORY_CARE_PROVIDER_SITE_OTHER)

## 2024-05-17 DIAGNOSIS — G4486 Cervicogenic headache: Secondary | ICD-10-CM

## 2024-05-17 MED ORDER — GADOBENATE DIMEGLUMINE 529 MG/ML IV SOLN
15.0000 mL | Freq: Once | INTRAVENOUS | Status: AC | PRN
Start: 2024-05-17 — End: 2024-05-17
  Administered 2024-05-17: 15 mL via INTRAVENOUS

## 2024-05-17 NOTE — Telephone Encounter (Signed)
 Pharmacy Patient Advocate Encounter  Received notification from [employer] that Prior Authorization for Qulipta  has been APPROVED from 05/16/2024 to 05/16/2025. Ran test claim, Copay is $0. This test claim was processed through [facility]- copay amounts may vary at other pharmacies due to pharmacy/plan contracts, or as the patient moves through the different stages of their insurance plan.   PA #/Case ID/Reference #: [id number]

## 2024-05-23 ENCOUNTER — Ambulatory Visit: Payer: Self-pay | Admitting: Family Medicine

## 2024-06-07 ENCOUNTER — Telehealth: Payer: Self-pay | Admitting: Family Medicine

## 2024-06-07 NOTE — Telephone Encounter (Signed)
 MYC cxl

## 2024-12-07 ENCOUNTER — Ambulatory Visit: Admitting: Family Medicine
